# Patient Record
Sex: Female | Born: 1960
Health system: Northeastern US, Community
[De-identification: ages and names within clinical notes are randomized; demographics above are authoritative.]

## PROBLEM LIST (undated history)

## (undated) DIAGNOSIS — Z8489 Family history of other specified conditions: Secondary | ICD-10-CM

## (undated) DIAGNOSIS — J45909 Unspecified asthma, uncomplicated: Secondary | ICD-10-CM

## (undated) DIAGNOSIS — A159 Respiratory tuberculosis unspecified: Secondary | ICD-10-CM

## (undated) DIAGNOSIS — T7840XA Allergy, unspecified, initial encounter: Secondary | ICD-10-CM

## (undated) DIAGNOSIS — I10 Essential (primary) hypertension: Secondary | ICD-10-CM

## (undated) DIAGNOSIS — Z90710 Acquired absence of both cervix and uterus: Secondary | ICD-10-CM

## (undated) DIAGNOSIS — E039 Hypothyroidism, unspecified: Secondary | ICD-10-CM

## (undated) HISTORY — PX: COLONOSCOPY: SHX174

## (undated) HISTORY — PX: ABDOMINAL HYSTERECTOMY: SHX81

## (undated) HISTORY — DX: Allergy, unspecified, initial encounter: T78.40XA

---

## 1998-06-19 ENCOUNTER — Other Ambulatory Visit: Admission: RE | Admit: 1998-06-19 | Discharge: 1998-06-19 | Payer: Self-pay | Admitting: Gynecology

## 1999-02-01 ENCOUNTER — Other Ambulatory Visit: Admission: RE | Admit: 1999-02-01 | Discharge: 1999-02-01 | Payer: Self-pay | Admitting: Obstetrics and Gynecology

## 1999-03-22 ENCOUNTER — Ambulatory Visit (HOSPITAL_COMMUNITY): Admission: RE | Admit: 1999-03-22 | Discharge: 1999-03-22 | Payer: Self-pay | Admitting: *Deleted

## 1999-03-22 ENCOUNTER — Encounter: Payer: Self-pay | Admitting: Obstetrics and Gynecology

## 1999-03-22 ENCOUNTER — Encounter: Payer: Self-pay | Admitting: *Deleted

## 1999-08-19 ENCOUNTER — Inpatient Hospital Stay (HOSPITAL_COMMUNITY): Admission: AD | Admit: 1999-08-19 | Discharge: 1999-08-21 | Payer: Self-pay | Admitting: Obstetrics and Gynecology

## 1999-08-23 ENCOUNTER — Encounter: Admission: RE | Admit: 1999-08-23 | Discharge: 1999-11-21 | Payer: Self-pay | Admitting: Obstetrics and Gynecology

## 2000-01-17 ENCOUNTER — Other Ambulatory Visit: Admission: RE | Admit: 2000-01-17 | Discharge: 2000-01-17 | Payer: Self-pay | Admitting: Obstetrics and Gynecology

## 2001-02-26 ENCOUNTER — Other Ambulatory Visit: Admission: RE | Admit: 2001-02-26 | Discharge: 2001-02-26 | Payer: Self-pay | Admitting: Obstetrics and Gynecology

## 2002-03-14 ENCOUNTER — Other Ambulatory Visit: Admission: RE | Admit: 2002-03-14 | Discharge: 2002-03-14 | Payer: Self-pay | Admitting: Obstetrics and Gynecology

## 2002-05-10 ENCOUNTER — Encounter: Payer: Self-pay | Admitting: Obstetrics and Gynecology

## 2002-05-10 ENCOUNTER — Encounter: Admission: RE | Admit: 2002-05-10 | Discharge: 2002-05-10 | Payer: Self-pay | Admitting: Obstetrics and Gynecology

## 2003-04-18 ENCOUNTER — Other Ambulatory Visit: Admission: RE | Admit: 2003-04-18 | Discharge: 2003-04-18 | Payer: Self-pay | Admitting: Obstetrics and Gynecology

## 2003-04-25 ENCOUNTER — Encounter: Admission: RE | Admit: 2003-04-25 | Discharge: 2003-04-25 | Payer: Self-pay | Admitting: Obstetrics and Gynecology

## 2003-04-25 ENCOUNTER — Encounter: Payer: Self-pay | Admitting: Obstetrics and Gynecology

## 2004-04-18 ENCOUNTER — Other Ambulatory Visit: Admission: RE | Admit: 2004-04-18 | Discharge: 2004-04-18 | Payer: Self-pay | Admitting: Obstetrics and Gynecology

## 2004-05-21 ENCOUNTER — Encounter: Admission: RE | Admit: 2004-05-21 | Discharge: 2004-05-21 | Payer: Self-pay | Admitting: Obstetrics and Gynecology

## 2005-06-02 ENCOUNTER — Other Ambulatory Visit: Admission: RE | Admit: 2005-06-02 | Discharge: 2005-06-02 | Payer: Self-pay | Admitting: Obstetrics and Gynecology

## 2005-07-18 ENCOUNTER — Encounter: Admission: RE | Admit: 2005-07-18 | Discharge: 2005-07-18 | Payer: Self-pay | Admitting: Obstetrics and Gynecology

## 2006-08-10 ENCOUNTER — Encounter: Admission: RE | Admit: 2006-08-10 | Discharge: 2006-08-10 | Payer: Self-pay | Admitting: Obstetrics and Gynecology

## 2007-09-07 ENCOUNTER — Encounter: Admission: RE | Admit: 2007-09-07 | Discharge: 2007-09-07 | Payer: Self-pay | Admitting: Obstetrics and Gynecology

## 2008-01-06 ENCOUNTER — Ambulatory Visit: Payer: Self-pay | Admitting: Chiropractic Medicine

## 2008-09-07 ENCOUNTER — Encounter: Admission: RE | Admit: 2008-09-07 | Discharge: 2008-09-07 | Payer: Self-pay | Admitting: Obstetrics and Gynecology

## 2009-09-10 ENCOUNTER — Encounter: Admission: RE | Admit: 2009-09-10 | Discharge: 2009-09-10 | Payer: Self-pay | Admitting: Obstetrics and Gynecology

## 2010-08-24 ENCOUNTER — Other Ambulatory Visit: Payer: Self-pay | Admitting: Obstetrics and Gynecology

## 2010-08-24 DIAGNOSIS — Z1231 Encounter for screening mammogram for malignant neoplasm of breast: Secondary | ICD-10-CM

## 2010-08-24 DIAGNOSIS — Z1239 Encounter for other screening for malignant neoplasm of breast: Secondary | ICD-10-CM

## 2010-09-11 ENCOUNTER — Ambulatory Visit
Admission: RE | Admit: 2010-09-11 | Discharge: 2010-09-11 | Disposition: A | Discharge status: Home / Self Care | Payer: PRIVATE HEALTH INSURANCE | Source: Ambulatory Visit | Attending: Obstetrics and Gynecology | Admitting: Obstetrics and Gynecology

## 2010-09-11 DIAGNOSIS — Z1231 Encounter for screening mammogram for malignant neoplasm of breast: Secondary | ICD-10-CM

## 2011-09-02 ENCOUNTER — Other Ambulatory Visit: Payer: Self-pay | Admitting: Obstetrics and Gynecology

## 2011-09-02 DIAGNOSIS — Z1231 Encounter for screening mammogram for malignant neoplasm of breast: Secondary | ICD-10-CM

## 2011-09-23 ENCOUNTER — Ambulatory Visit
Admission: RE | Admit: 2011-09-23 | Discharge: 2011-09-23 | Disposition: A | Discharge status: Home / Self Care | Payer: PRIVATE HEALTH INSURANCE | Source: Ambulatory Visit | Attending: Obstetrics and Gynecology | Admitting: Obstetrics and Gynecology

## 2011-09-23 DIAGNOSIS — Z1231 Encounter for screening mammogram for malignant neoplasm of breast: Secondary | ICD-10-CM

## 2011-11-12 LAB — HM MAMMOGRAPHY: HM Mammogram: NORMAL

## 2012-07-29 ENCOUNTER — Telehealth: Payer: Self-pay | Admitting: Internal Medicine

## 2012-07-29 NOTE — Telephone Encounter (Signed)
Cam we use a 30 minute physical slot to work her in before March? If not, you can use 2 urgents

## 2012-07-29 NOTE — Telephone Encounter (Signed)
Patient would like to come in and see you, she states her blood pressure has been up and she saw you at the old practice. I advised patient that she would be a new patient and she didn't want to wait until March to be able to see you. I did offer her an appointment with Raquel, however she wanted me to ask if you would see her.  She states her whole family comes here. We can also call her on her cell [phone]

## 2012-08-02 LAB — HM PAP SMEAR: HM Pap smear: NORMAL

## 2012-08-03 NOTE — Telephone Encounter (Signed)
Called and left message asking patient to return my call so I could make her appointment.

## 2012-08-09 NOTE — Telephone Encounter (Signed)
Appointment 1/101/14 pt aware of appointment

## 2012-08-13 ENCOUNTER — Encounter: Payer: Self-pay | Admitting: Internal Medicine

## 2012-08-13 ENCOUNTER — Ambulatory Visit (INDEPENDENT_AMBULATORY_CARE_PROVIDER_SITE_OTHER): Payer: PRIVATE HEALTH INSURANCE | Admitting: Internal Medicine

## 2012-08-13 VITALS — BP 120/66 | HR 72 | Temp 98.3°F | Resp 16 | Wt 160.5 lb

## 2012-08-13 DIAGNOSIS — Z79899 Other long term (current) drug therapy: Secondary | ICD-10-CM

## 2012-08-13 DIAGNOSIS — Z1211 Encounter for screening for malignant neoplasm of colon: Secondary | ICD-10-CM

## 2012-08-13 DIAGNOSIS — I10 Essential (primary) hypertension: Secondary | ICD-10-CM

## 2012-08-13 DIAGNOSIS — Z13220 Encounter for screening for lipoid disorders: Secondary | ICD-10-CM

## 2012-08-13 NOTE — Patient Instructions (Addendum)
Return at your leisure for fasting lipids and other blood tests  (ok to drink water past midnight but no other beverages or food )  I will refill your lisinopril as soon as I review the labs from today

## 2012-08-13 NOTE — Progress Notes (Signed)
Patient ID: Tonya Gregory, female   DOB: 1961/05/30, 52 y.o.   MRN: [id number]  Patient Active Problem List  Diagnosis  . Hypertension  . Screening for lipoid disorders    Subjective:  CC:   Chief Complaint  Patient presents with  . Blood Pressure    HPI:   Tonya Gregory a 52 y.o. female who presents  History reviewed. No pertinent past medical history.  History reviewed. No pertinent past surgical history.       The following portions of the patient's history were reviewed and updated as appropriate: Allergies, current medications, and problem list.    Review of Systems:  Patient denies headache, fevers, malaise, unintentional weight loss, skin rash, eye pain, sinus congestion and sinus pain, sore throat, dysphagia,  hemoptysis , cough, dyspnea, wheezing, chest pain, palpitations, orthopnea, edema, abdominal pain, nausea, melena, diarrhea, constipation, flank pain, dysuria, hematuria, urinary  Frequency, nocturia, numbness, tingling, seizures,  Focal weakness, Loss of consciousness,  Tremor, insomnia, depression, anxiety, and suicidal ideation.       History   Social History  . Marital Status: Married    Spouse Name: N/A    Number of Children: N/A  . Years of Education: N/A   Occupational History  . Not on file.   Social History Main Topics  . Smoking status: Never Smoker   . Smokeless tobacco: Not on file  . Alcohol Use: Yes  . Drug Use: No  . Sexually Active:    Other Topics Concern  . Not on file   Social History Narrative  . No narrative on file    Objective:  BP 120/66  Pulse 72  Temp 98.3 F (36.8 C) (Oral)  Resp 16  Wt 160 lb 8 oz (72.802 kg)  SpO2 99%  LMP 07/13/2012  General appearance: alert, cooperative and appears stated age Ears: normal TM's and external ear canals both ears Throat: lips, mucosa, and tongue normal; teeth and gums normal Neck: no adenopathy, no carotid bruit, supple, symmetrical, trachea midline and thyroid  not enlarged, symmetric, no tenderness/mass/nodules Back: symmetric, no curvature. ROM normal. No CVA tenderness. Lungs: clear to auscultation bilaterally Heart: regular rate and rhythm, S1, S2 normal, no murmur, click, rub or gallop Abdomen: soft, non-tender; bowel sounds normal; no masses,  no organomegaly Pulses: 2+ and symmetric Skin: Skin color, texture, turgor normal. No rashes or lesions Lymph nodes: Cervical, supraclavicular, and axillary nodes normal.  Assessment and Plan:  Hypertension Discussed. She has no history of snoring or sleep apnea. She does not use NSAIDs on a regular basis as well as decongestants. She has no history of thyroid problems diabetes or nephropathy. She is tolerating lisinopril without side effects. Potassium and creatinine are both normal. Continue current medication. Return for fasting lipids.  Screening for lipoid disorders Given her maternal history of stroke in her new onset hypertension she will return for fasting lipids and we will treat aggressively for LDL goal 100 of 100.    Updated Medication List Outpatient Encounter Prescriptions as of 08/13/2012  Medication Sig Dispense Refill  . lisinopril (PRINIVIL,ZESTRIL) 10 MG tablet Take 10 mg by mouth daily.          Orders Placed This Encounter  Procedures  . HM MAMMOGRAPHY  . HM PAP SMEAR  . Basic Metabolic Panel (BMET)  . Ambulatory referral to Gastroenterology    Return in about 6 months (around 02/10/2013).

## 2012-08-14 LAB — BASIC METABOLIC PANEL
BUN: 15 mg/dL (ref 6–23)
CO2: 26 mEq/L (ref 19–32)
Calcium: 9.3 mg/dL (ref 8.4–10.5)
Chloride: 104 mEq/L (ref 96–112)
Creat: 0.79 mg/dL (ref 0.50–1.10)
Glucose, Bld: 79 mg/dL (ref 70–99)
Potassium: 4.2 mEq/L (ref 3.5–5.3)
Sodium: 135 mEq/L (ref 135–145)

## 2012-08-15 DIAGNOSIS — I10 Essential (primary) hypertension: Secondary | ICD-10-CM | POA: Insufficient documentation

## 2012-08-15 DIAGNOSIS — Z13220 Encounter for screening for lipoid disorders: Secondary | ICD-10-CM | POA: Insufficient documentation

## 2012-08-15 NOTE — Assessment & Plan Note (Signed)
She is tolerating lisinopril without side effects. Potassium and creatinine are both normal. Continue current medication. Return for fasting lipids.

## 2012-08-15 NOTE — Assessment & Plan Note (Signed)
Given her maternal history of stroke in her new onset hypertension she will return for fasting lipids and we will treat aggressively for LDL goal 100 of 100.

## 2012-08-18 ENCOUNTER — Other Ambulatory Visit (INDEPENDENT_AMBULATORY_CARE_PROVIDER_SITE_OTHER): Payer: PRIVATE HEALTH INSURANCE

## 2012-08-18 DIAGNOSIS — I10 Essential (primary) hypertension: Secondary | ICD-10-CM

## 2012-08-18 LAB — MICROALBUMIN / CREATININE URINE RATIO
Creatinine,U: 96 mg/dL
Microalb Creat Ratio: 0.4 mg/g (ref 0.0–30.0)
Microalb, Ur: 0.4 mg/dL (ref 0.0–1.9)

## 2012-08-18 LAB — COMPREHENSIVE METABOLIC PANEL
ALT: 19 U/L (ref 0–35)
AST: 16 U/L (ref 0–37)
Albumin: 4 g/dL (ref 3.5–5.2)
Alkaline Phosphatase: 41 U/L (ref 39–117)
BUN: 17 mg/dL (ref 6–23)
CO2: 26 mEq/L (ref 19–32)
Calcium: 8.9 mg/dL (ref 8.4–10.5)
Chloride: 104 mEq/L (ref 96–112)
Creatinine, Ser: 0.9 mg/dL (ref 0.4–1.2)
GFR: 68.03 mL/min (ref >60.00)
Glucose, Bld: 109 mg/dL — ABNORMAL HIGH (ref 70–99)
Potassium: 4.4 mEq/L (ref 3.5–5.1)
Sodium: 136 mEq/L (ref 135–145)
Total Bilirubin: 1 mg/dL (ref 0.3–1.2)
Total Protein: 7.1 g/dL (ref 6.0–8.3)

## 2012-08-18 LAB — LIPID PANEL
Cholesterol: 181 mg/dL (ref 0–200)
HDL: 46.9 mg/dL (ref >39.00)
LDL Cholesterol: 117 mg/dL — ABNORMAL HIGH (ref 0–99)
Total CHOL/HDL Ratio: 4
Triglycerides: 86 mg/dL (ref 0.0–149.0)
VLDL: 17.2 mg/dL (ref 0.0–40.0)

## 2012-08-18 LAB — TSH: TSH: 1.37 u[IU]/mL (ref 0.35–5.50)

## 2012-08-23 ENCOUNTER — Other Ambulatory Visit: Payer: Self-pay | Admitting: General Practice

## 2012-08-23 ENCOUNTER — Other Ambulatory Visit: Payer: Self-pay | Admitting: Internal Medicine

## 2012-08-23 MED ORDER — LISINOPRIL 10 MG PO TABS: 10.0000 mg | ORAL_TABLET | Freq: Every day | ORAL | Status: DC

## 2012-08-23 NOTE — Telephone Encounter (Signed)
Med filled.  

## 2012-08-23 NOTE — Telephone Encounter (Signed)
lisinopril (PRINIVIL,ZESTRIL) 10 MG tablet  #30

## 2012-09-06 ENCOUNTER — Other Ambulatory Visit: Payer: Self-pay | Admitting: Obstetrics and Gynecology

## 2012-09-06 DIAGNOSIS — Z1231 Encounter for screening mammogram for malignant neoplasm of breast: Secondary | ICD-10-CM

## 2012-10-07 ENCOUNTER — Ambulatory Visit
Admission: RE | Admit: 2012-10-07 | Discharge: 2012-10-07 | Disposition: A | Discharge status: Home / Self Care | Payer: PRIVATE HEALTH INSURANCE | Source: Ambulatory Visit | Attending: Obstetrics and Gynecology | Admitting: Obstetrics and Gynecology

## 2012-10-07 DIAGNOSIS — Z1231 Encounter for screening mammogram for malignant neoplasm of breast: Secondary | ICD-10-CM

## 2012-10-14 ENCOUNTER — Other Ambulatory Visit: Payer: Self-pay | Admitting: Obstetrics and Gynecology

## 2012-10-14 DIAGNOSIS — R928 Other abnormal and inconclusive findings on diagnostic imaging of breast: Secondary | ICD-10-CM

## 2012-10-26 ENCOUNTER — Ambulatory Visit
Admission: RE | Admit: 2012-10-26 | Discharge: 2012-10-26 | Disposition: A | Discharge status: Home / Self Care | Payer: PRIVATE HEALTH INSURANCE | Source: Ambulatory Visit | Attending: Obstetrics and Gynecology | Admitting: Obstetrics and Gynecology

## 2012-10-26 DIAGNOSIS — R928 Other abnormal and inconclusive findings on diagnostic imaging of breast: Secondary | ICD-10-CM

## 2012-11-11 ENCOUNTER — Other Ambulatory Visit: Payer: Self-pay | Admitting: Internal Medicine

## 2012-11-11 NOTE — Telephone Encounter (Signed)
Med filled.  

## 2012-12-01 LAB — HM COLONOSCOPY: HM Colonoscopy: NORMAL

## 2012-12-02 ENCOUNTER — Ambulatory Visit: Payer: Self-pay | Admitting: Gastroenterology

## 2013-01-17 ENCOUNTER — Encounter: Payer: Self-pay | Admitting: Internal Medicine

## 2013-02-26 ENCOUNTER — Other Ambulatory Visit: Payer: Self-pay | Admitting: Internal Medicine

## 2013-02-27 NOTE — Telephone Encounter (Signed)
Refill done . Please remind patient to return for 6 month  Follow up on hypertension prior to any more refills

## 2013-06-03 ENCOUNTER — Other Ambulatory Visit: Payer: Self-pay | Admitting: Internal Medicine

## 2013-10-03 ENCOUNTER — Other Ambulatory Visit: Payer: Self-pay

## 2013-10-03 DIAGNOSIS — Z1231 Encounter for screening mammogram for malignant neoplasm of breast: Secondary | ICD-10-CM

## 2013-10-13 ENCOUNTER — Ambulatory Visit
Admission: RE | Admit: 2013-10-13 | Discharge: 2013-10-13 | Disposition: A | Discharge status: Home / Self Care | Payer: PRIVATE HEALTH INSURANCE | Source: Ambulatory Visit

## 2013-10-13 DIAGNOSIS — Z1231 Encounter for screening mammogram for malignant neoplasm of breast: Secondary | ICD-10-CM

## 2013-10-18 ENCOUNTER — Other Ambulatory Visit: Payer: Self-pay | Admitting: Internal Medicine

## 2013-10-24 ENCOUNTER — Other Ambulatory Visit: Payer: Self-pay | Admitting: Internal Medicine

## 2013-10-24 MED ORDER — LISINOPRIL 10 MG PO TABS: ORAL_TABLET | ORAL | Status: DC

## 2013-10-24 NOTE — Addendum Note (Signed)
Addended by: Vernetta Honey on: 10/24/2013 04:40 PM   Modules accepted: Orders

## 2013-10-31 LAB — HM MAMMOGRAPHY: HM Mammogram: NORMAL

## 2013-11-20 ENCOUNTER — Other Ambulatory Visit: Payer: Self-pay | Admitting: Internal Medicine

## 2013-12-01 ENCOUNTER — Ambulatory Visit (INDEPENDENT_AMBULATORY_CARE_PROVIDER_SITE_OTHER): Payer: PRIVATE HEALTH INSURANCE | Admitting: Internal Medicine

## 2013-12-01 ENCOUNTER — Encounter: Payer: Self-pay | Admitting: Internal Medicine

## 2013-12-01 VITALS — BP 120/72 | HR 67 | Temp 97.9°F | Resp 16 | Ht 68.5 in | Wt 166.0 lb

## 2013-12-01 DIAGNOSIS — E559 Vitamin D deficiency, unspecified: Secondary | ICD-10-CM

## 2013-12-01 DIAGNOSIS — Z79899 Other long term (current) drug therapy: Secondary | ICD-10-CM

## 2013-12-01 DIAGNOSIS — N926 Irregular menstruation, unspecified: Secondary | ICD-10-CM

## 2013-12-01 DIAGNOSIS — I10 Essential (primary) hypertension: Secondary | ICD-10-CM

## 2013-12-01 DIAGNOSIS — Z13220 Encounter for screening for lipoid disorders: Secondary | ICD-10-CM

## 2013-12-01 DIAGNOSIS — E785 Hyperlipidemia, unspecified: Secondary | ICD-10-CM

## 2013-12-01 DIAGNOSIS — Z0000 Encounter for general adult medical examination without abnormal findings: Secondary | ICD-10-CM

## 2013-12-01 LAB — COMPREHENSIVE METABOLIC PANEL
ALT: 22 U/L (ref 0–35)
AST: 19 U/L (ref 0–37)
Albumin: 4.2 g/dL (ref 3.5–5.2)
Alkaline Phosphatase: 43 U/L (ref 39–117)
BUN: 15 mg/dL (ref 6–23)
CO2: 25 mEq/L (ref 19–32)
Calcium: 9.2 mg/dL (ref 8.4–10.5)
Chloride: 105 mEq/L (ref 96–112)
Creatinine, Ser: 0.7 mg/dL (ref 0.4–1.2)
GFR: 92.79 mL/min (ref >60.00)
Glucose, Bld: 99 mg/dL (ref 70–99)
Potassium: 4.5 mEq/L (ref 3.5–5.1)
Sodium: 137 mEq/L (ref 135–145)
Total Bilirubin: 0.8 mg/dL (ref 0.3–1.2)
Total Protein: 6.9 g/dL (ref 6.0–8.3)

## 2013-12-01 LAB — CBC WITH DIFFERENTIAL/PLATELET
Basophils Absolute: 0 10*3/uL (ref 0.0–0.1)
Basophils Relative: 0.4 % (ref 0.0–3.0)
Eosinophils Absolute: 0.1 10*3/uL (ref 0.0–0.7)
Eosinophils Relative: 2.1 % (ref 0.0–5.0)
HCT: 37.7 % (ref 36.0–46.0)
Hemoglobin: 12.8 g/dL (ref 12.0–15.0)
Lymphocytes Relative: 40.4 % (ref 12.0–46.0)
Lymphs Abs: 2 10*3/uL (ref 0.7–4.0)
MCHC: 33.8 g/dL (ref 30.0–36.0)
MCV: 91.8 fl (ref 78.0–100.0)
Monocytes Absolute: 0.3 10*3/uL (ref 0.1–1.0)
Monocytes Relative: 6.3 % (ref 3.0–12.0)
Neutro Abs: 2.6 10*3/uL (ref 1.4–7.7)
Neutrophils Relative %: 50.8 % (ref 43.0–77.0)
Platelets: 246 10*3/uL (ref 150.0–400.0)
RBC: 4.11 Mil/uL (ref 3.87–5.11)
RDW: 12.6 % (ref 11.5–14.6)
WBC: 5 10*3/uL (ref 4.5–10.5)

## 2013-12-01 LAB — FOLLICLE STIMULATING HORMONE: FSH: 49.5 m[IU]/mL

## 2013-12-01 LAB — LIPID PANEL
Cholesterol: 193 mg/dL (ref 0–200)
HDL: 49.9 mg/dL (ref >39.00)
LDL Cholesterol: 125 mg/dL — ABNORMAL HIGH (ref 0–99)
Total CHOL/HDL Ratio: 4
Triglycerides: 93 mg/dL (ref 0.0–149.0)
VLDL: 18.6 mg/dL (ref 0.0–40.0)

## 2013-12-01 LAB — TSH: TSH: 1.67 u[IU]/mL (ref 0.35–5.50)

## 2013-12-01 LAB — LUTEINIZING HORMONE: LH: 12.09 m[IU]/mL

## 2013-12-01 NOTE — Progress Notes (Signed)
Pre-visit discussion using our clinic review tool. No additional management support is needed unless otherwise documented below in the visit note.  

## 2013-12-01 NOTE — Progress Notes (Signed)
Patient ID: Tonya Gregory, female   DOB: 1961/01/10, 53 y.o.   MRN: [id number]   Subjective:     Tonya Gregory is a 53 y.o. female and is here for a comprehensive physical exam. The patient reports occasional low back pain,  Has DDD by prior MRI and not interested in surgery since pain is not constant  Still having menstrual periods with cramping and heavy flow but flow ends after 3 days    History   Social History  . Marital Status: Married    Spouse Name: N/A    Number of Children: N/A  . Years of Education: N/A   Occupational History  . Not on file.   Social History Main Topics  . Smoking status: Never Smoker   . Smokeless tobacco: Not on file  . Alcohol Use: Yes  . Drug Use: No  . Sexual Activity:    Other Topics Concern  . Not on file   Social History Narrative  . No narrative on file   Health Maintenance  Topic Date Due  . Tetanus/tdap  03/26/1979  . Influenza Vaccine  03/04/2014  . Pap Smear  08/03/2015  . Mammogram  11/01/2015  . Colonoscopy  12/03/2022    The following portions of the patient's history were reviewed and updated as appropriate: allergies, current medications, past family history, past medical history, past social history, past surgical history and problem list.  Review of Systems A comprehensive review of systems was negative.   Objective:    BP 120/72  Pulse 67  Temp(Src) 97.9 F (36.6 C) (Oral)  Resp 16  Ht 5' 8.5" (1.74 m)  Wt 166 lb (75.297 kg)  BMI 24.87 kg/m2  SpO2 99%  LMP 11/24/2013  General appearance: alert, cooperative and appears stated age Head: Normocephalic, without obvious abnormality, atraumatic Eyes: conjunctivae/corneas clear. PERRL, EOM's intact. Fundi benign. Ears: normal TM's and external ear canals both ears Nose: Nares normal. Septum midline. Mucosa normal. No drainage or sinus tenderness. Throat: lips, mucosa, and tongue normal; teeth and gums normal Neck: no adenopathy, no carotid bruit, no JVD,  supple, symmetrical, trachea midline and thyroid not enlarged, symmetric, no tenderness/mass/nodules Lungs: clear to auscultation bilaterally Breasts: normal appearance, no masses or tenderness Heart: regular rate and rhythm, S1, S2 normal, no murmur, click, rub or gallop Abdomen: soft, non-tender; bowel sounds normal; no masses,  no organomegaly Extremities: extremities normal, atraumatic, no cyanosis or edema Pulses: 2+ and symmetric Skin: Skin color, texture, turgor normal. No rashes or lesions Neurologic: Alert and oriented X 3, normal strength and tone. Normal symmetric reflexes. Normal coordination and gait.    Assessment and Plan:   Hypertension Well controlled on current regimen. Renal function stable, no changes today.  Lab Results  Component Value Date   CREATININE 0.7 12/01/2013    Lab Results  Component Value Date   NA 137 12/01/2013   K 4.5 12/01/2013   CL 105 12/01/2013   CO2 25 12/01/2013    Screening for lipoid disorders Lab Results  Component Value Date   CHOL 193 12/01/2013   HDL 49.90 12/01/2013   LDLCALC 125* 12/01/2013   TRIG 93.0 12/01/2013   CHOLHDL 4 12/01/2013   cholesterol, liver and kidney function are normal.  No medicatiobs indicated  Encounter for preventive health examination Annual comprehensive exam was done excluding breast, pelvic and PAP smear. All screenings have been addressed .    Updated Medication List Outpatient Encounter Prescriptions as of 12/01/2013  Medication Sig  . lisinopril (PRINIVIL,ZESTRIL)  10 MG tablet TAKE 1 TABLET (10 MG TOTAL) BY MOUTH DAILY.

## 2013-12-01 NOTE — Patient Instructions (Signed)
You had your annual  wellness exam today.  We will repeat your PAP smear in 2016,    We will contact you with the bloodwork results

## 2013-12-02 LAB — VITAMIN D 25 HYDROXY (VIT D DEFICIENCY, FRACTURES): Vit D, 25-Hydroxy: 36 ng/mL (ref 30–89)

## 2013-12-03 DIAGNOSIS — Z0000 Encounter for general adult medical examination without abnormal findings: Secondary | ICD-10-CM | POA: Insufficient documentation

## 2013-12-03 NOTE — Assessment & Plan Note (Signed)
Annual comprehensive exam was done excluding breast, pelvic and PAP smear. All screenings have been addressed .  

## 2013-12-03 NOTE — Assessment & Plan Note (Signed)
Well controlled on current regimen. Renal function stable, no changes today.  Lab Results  Component Value Date   CREATININE 0.7 12/01/2013    Lab Results  Component Value Date   NA 137 12/01/2013   K 4.5 12/01/2013   CL 105 12/01/2013   CO2 25 12/01/2013

## 2013-12-03 NOTE — Assessment & Plan Note (Signed)
Lab Results  Component Value Date   CHOL 193 12/01/2013   HDL 49.90 12/01/2013   LDLCALC 125* 12/01/2013   TRIG 93.0 12/01/2013   CHOLHDL 4 12/01/2013   cholesterol, liver and kidney function are normal.  No medicatiobs indicated

## 2013-12-05 ENCOUNTER — Encounter: Payer: Self-pay | Admitting: *Deleted

## 2013-12-28 ENCOUNTER — Other Ambulatory Visit: Payer: Self-pay | Admitting: Internal Medicine

## 2014-06-04 ENCOUNTER — Other Ambulatory Visit: Payer: Self-pay | Admitting: Internal Medicine

## 2014-08-28 ENCOUNTER — Encounter: Payer: Self-pay | Admitting: Internal Medicine

## 2014-08-28 ENCOUNTER — Ambulatory Visit (INDEPENDENT_AMBULATORY_CARE_PROVIDER_SITE_OTHER): Payer: PRIVATE HEALTH INSURANCE | Admitting: Internal Medicine

## 2014-08-28 VITALS — BP 104/66 | HR 78 | Temp 98.4°F | Resp 14 | Ht 68.5 in | Wt 169.2 lb

## 2014-08-28 DIAGNOSIS — R5081 Fever presenting with conditions classified elsewhere: Secondary | ICD-10-CM

## 2014-08-28 DIAGNOSIS — I10 Essential (primary) hypertension: Secondary | ICD-10-CM

## 2014-08-28 DIAGNOSIS — J020 Streptococcal pharyngitis: Secondary | ICD-10-CM | POA: Insufficient documentation

## 2014-08-28 DIAGNOSIS — R509 Fever, unspecified: Secondary | ICD-10-CM

## 2014-08-28 LAB — POCT RAPID STREP A (OFFICE): Rapid Strep A Screen: POSITIVE — AB

## 2014-08-28 MED ORDER — AMOXICILLIN-POT CLAVULANATE 875-125 MG PO TABS: 1.0000 | ORAL_TABLET | Freq: Two times a day (BID) | ORAL | Status: DC

## 2014-08-28 MED ORDER — CULTURELLE DIGESTIVE HEALTH PO CAPS: 1.0000 | ORAL_CAPSULE | Freq: Every day | ORAL | Status: DC

## 2014-08-28 NOTE — Progress Notes (Signed)
Patient ID: Tonya Gregory, female   DOB: 12-27-1960, 54 y.o.   MRN: [id number]   Patient Active Problem List   Diagnosis Date Noted  . Fever presenting with conditions classified elsewhere 08/29/2014  . Streptococcal pharyngitis 08/28/2014  . Encounter for preventive health examination 12/03/2013  . Hypertension 08/15/2012  . Screening for lipoid disorders 08/15/2012    Subjective:  CC:   Chief Complaint  Patient presents with  . Fever    104 taking advil alternating with tylenol, no other symptom just fever    HPI:   Tonya Gregory is a 54 y.o. female who presents for  5 day history of recurrent fevers up to 104 Fahrenheit,  Last one last evening.  Started last Thursday,  Was vacationing  in [place] until January 1 .  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  Daughter with viral URi symptoms,  but no fevers.  No arthritis symptoms,  No rash,  But mild persistent nausea,  Attributed to use of advil but has not been overdoing it and has been alternating with tylenol for management of fever.    Has suspended her lisinopril for the last few days due to feeling poorly.  No back pain headaches or neck pain.     No past medical history on file.  No past surgical history on file.     The following portions of the patient's history were reviewed and updated as appropriate: Allergies, current medications, and problem list.    Review of Systems:   Patient denies headache,  unintentional weight loss, skin rash, eye pain, sinus congestion and sinus pain, sore throat, dysphagia,  hemoptysis , cough, dyspnea, wheezing, chest pain, palpitations, orthopnea, edema, abdominal pain,  melena, diarrhea, constipation, flank pain, dysuria, hematuria, urinary  Frequency, nocturia, numbness, tingling, seizures,  Focal weakness, Loss of consciousness,  Tremor, insomnia, depression, anxiety, and suicidal ideation.     History   Social  History  . Marital Status: Married    Spouse Name: N/A    Number of Children: N/A  . Years of Education: N/A   Occupational History  . Not on file.   Social History Main Topics  . Smoking status: Never Smoker   . Smokeless tobacco: Not on file  . Alcohol Use: Yes  . Drug Use: No  . Sexual Activity: Not on file   Other Topics Concern  . Not on file   Social History Narrative    Objective:  Filed Vitals:   08/28/14 1501  BP: 104/66  Pulse: 78  Temp: 98.4 F (36.9 C)  Resp: 14     General appearance: alert, cooperative and appears stated age Ears: normal TM's and external ear canals both ears Throat: lips, mucosa, and tongue normal; teeth and gums normal Neck: mild cervical lympadenopathy, no carotid bruit, supple, symmetrical, trachea midline and thyroid not enlarged, symmetric, no tenderness/mass/nodules Back: symmetric, no curvature. ROM normal. No CVA tenderness. Lungs: clear to auscultation bilaterally Heart: regular rate and rhythm, S1, S2 normal, no murmur, click, rub or gallop Abdomen: soft, non-tender; bowel sounds normal; no masses,  no organomegaly Pulses: 2+ and symmetric Skin: Skin color, texture, turgor normal. No rashes or lesions Lymph nodes: Cervical, supraclavicular, and axillary nodes normal.  Assessment and Plan:  Problem List Items Addressed This Visit    Fever presenting with conditions classified elsewhere    Considering her recent [place] travel,  ddx includes  dengue fever and  chikungunya,  But she is outside the incubation period for both infections,  and fever and nausea are her only symptoms.  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 Probiotics x 3 weeks advised. CBC is pending        Hypertension    Advised to suspend lisinopril until SBP is > 140      Streptococcal pharyngitis    Presenting with 5 days of high fever,   And nausea.  Rapid strep test was positive.  Augmentin x 7 days        Other Visit Diagnoses    Recurrent fever of unknown cause    -  Primary    Relevant Orders    Basic metabolic panel    CBC with Differential/Platelet    POCT rapid strep A (Completed)

## 2014-08-28 NOTE — Progress Notes (Signed)
Pre-visit discussion using our clinic review tool. No additional management support is needed unless otherwise documented below in the visit note.  

## 2014-08-28 NOTE — Patient Instructions (Signed)
Your fevers may be coming from e viral infection,  Or from strep throat  If your labs are normal and the strep test is negative,  Let's give it a full week to see if the infection  resolves.

## 2014-08-29 DIAGNOSIS — R5081 Fever presenting with conditions classified elsewhere: Secondary | ICD-10-CM | POA: Insufficient documentation

## 2014-08-29 LAB — CBC WITH DIFFERENTIAL/PLATELET
Basophils Absolute: 0 10*3/uL (ref 0.0–0.1)
Basophils Relative: 0.2 % (ref 0.0–3.0)
Eosinophils Absolute: 0 10*3/uL (ref 0.0–0.7)
Eosinophils Relative: 0.7 % (ref 0.0–5.0)
HCT: 37.8 % (ref 36.0–46.0)
Hemoglobin: 12.9 g/dL (ref 12.0–15.0)
Lymphocytes Relative: 21.1 % (ref 12.0–46.0)
Lymphs Abs: 1.1 10*3/uL (ref 0.7–4.0)
MCHC: 34.1 g/dL (ref 30.0–36.0)
MCV: 85.8 fl (ref 78.0–100.0)
Monocytes Absolute: 0.2 10*3/uL (ref 0.1–1.0)
Monocytes Relative: 4.8 % (ref 3.0–12.0)
Neutro Abs: 3.8 10*3/uL (ref 1.4–7.7)
Neutrophils Relative %: 73.2 % (ref 43.0–77.0)
Platelets: 100 10*3/uL — ABNORMAL LOW (ref 150.0–400.0)
RBC: 4.41 Mil/uL (ref 3.87–5.11)
RDW: 13.4 % (ref 11.5–15.5)
WBC: 5.2 10*3/uL (ref 4.0–10.5)

## 2014-08-29 LAB — BASIC METABOLIC PANEL
BUN: 10 mg/dL (ref 6–23)
CO2: 24 mEq/L (ref 19–32)
Calcium: 9 mg/dL (ref 8.4–10.5)
Chloride: 102 mEq/L (ref 96–112)
Creatinine, Ser: 0.76 mg/dL (ref 0.40–1.20)
GFR: 84.16 mL/min (ref >60.00)
Glucose, Bld: 126 mg/dL — ABNORMAL HIGH (ref 70–99)
Potassium: 4.3 mEq/L (ref 3.5–5.1)
Sodium: 136 mEq/L (ref 135–145)

## 2014-08-29 NOTE — Assessment & Plan Note (Signed)
Advised to suspend lisinopril until SBP is > 140

## 2014-08-29 NOTE — Assessment & Plan Note (Signed)
Presenting with 5 days of high fever,  And nausea.  Rapid strep test was positive.  Augmentin x 7 days

## 2014-08-29 NOTE — Assessment & Plan Note (Addendum)
Considering her recent [place] travel,  ddx includes  dengue fever and chikungunya,  But she is outside the incubation period for both infections,  and fever and nausea are her only symptoms.  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 Probiotics x 3 weeks advised. CBC is pending

## 2014-09-01 ENCOUNTER — Telehealth: Payer: Self-pay | Admitting: *Deleted

## 2014-09-01 DIAGNOSIS — R509 Fever, unspecified: Secondary | ICD-10-CM

## 2014-09-01 NOTE — Telephone Encounter (Signed)
Pt notified and  verbalized understanding. Advised [facility] would call with an appt

## 2014-09-01 NOTE — Telephone Encounter (Signed)
HER BLOOD WORK WAS NORMAL, NO SIGNS OF INFECTION , ONLY THE RAPID STREP TEST WAS POSITIVE. Marland Kitchen  IF SHE IS STILL HAVING FEVERS DESPITE TAKING THE AUMENTIN,  I AM MAKING A REFERRAL TO INFECTIOUS DISEASE FOR FURTHER WORKUP

## 2014-09-01 NOTE — Telephone Encounter (Signed)
Pt states she still has temp of 102. No other symptoms, taking antibiotic as directed. States only feels better when she takes Ibuprofen. Also wants to know results of her blood test from earlier this week

## 2014-09-18 ENCOUNTER — Ambulatory Visit: Payer: PRIVATE HEALTH INSURANCE | Admitting: Infectious Diseases

## 2014-09-21 ENCOUNTER — Other Ambulatory Visit: Payer: Self-pay

## 2014-09-21 DIAGNOSIS — Z1231 Encounter for screening mammogram for malignant neoplasm of breast: Secondary | ICD-10-CM

## 2014-10-19 ENCOUNTER — Ambulatory Visit
Admission: RE | Admit: 2014-10-19 | Discharge: 2014-10-19 | Disposition: A | Discharge status: Home / Self Care | Payer: No Typology Code available for payment source | Source: Ambulatory Visit

## 2014-10-19 DIAGNOSIS — Z1231 Encounter for screening mammogram for malignant neoplasm of breast: Secondary | ICD-10-CM

## 2015-09-17 ENCOUNTER — Other Ambulatory Visit: Payer: Self-pay

## 2015-09-17 DIAGNOSIS — Z1231 Encounter for screening mammogram for malignant neoplasm of breast: Secondary | ICD-10-CM

## 2015-10-22 ENCOUNTER — Ambulatory Visit
Admission: RE | Admit: 2015-10-22 | Discharge: 2015-10-22 | Disposition: A | Discharge status: Home / Self Care | Payer: Managed Care, Other (non HMO) | Source: Ambulatory Visit

## 2015-10-22 DIAGNOSIS — Z1231 Encounter for screening mammogram for malignant neoplasm of breast: Secondary | ICD-10-CM

## 2015-11-06 NOTE — H&P (Signed)
Tonya Gregory is a 55 y.o. female here for Pre Op Consulting (Endometrial Polyp)  Referral from [facility].  HPI:  Pt presents for a preoperative visit to schedule a D&C, hysteroscopy, and polypectomy.  She has a hx of: postmenopausal bleeding  Workup has included:  10/23/15 TVUS -  Two fibroids seen 1 ant=11.5 mm 2 fundal ant =17.1 mm Thickened endometrium=18.04 mm Abn postmenopause  Rt ov wnl Lt simple ov cyst= 2.40 cm  EMBx: 10/15/15- endometrial polyps, neg for malignancy  Pap smear 11/2014- neg with neg HR HPV  She also notes concerning and embarrassing SUI  Past Medical History:  has a past medical history of Asthma without status asthmaticus.  Past Surgical History:  has a past surgical history that includes Colonoscopy. Family History: family history includes Colon polyps in her mother; Stroke in her mother. Social History:  reports that she has never smoked. She has never used smokeless tobacco. She reports that she drinks alcohol. She reports that she does not use illicit drugs. OB/GYN History:  OB History    Gravida Para Term Preterm AB TAB SAB Ectopic Multiple Living   3 3 3       3       Allergies: is allergic to ace inhibitors. Medications:  Current Outpatient Prescriptions:  . albuterol 90 mcg/actuation inhaler, Inhale 2 inhalations into the lungs 4 (four) times daily as needed for Wheezing., Disp: , Rfl:  . aspirin 81 MG EC tablet, Take 81 mg by mouth once daily. Reported on 10/30/2015 , Disp: , Rfl:  . lisinopril (PRINIVIL,ZESTRIL) 10 MG tablet, Take 10 mg by mouth once daily. Reported on 10/30/2015 , Disp: , Rfl:   Review of Systems: No SOB, no palpitations or chest pain, no new lower extremity edema, no nausea or vomiting or bowel or bladder complaints. See HPI for gyn specific ROS.  Exam:      Vitals:   10/30/15 1004  BP: 142/81  Pulse: 75    WDWN white female in NAD Body mass index is 26.4 kg/(m^2).  General: Patient is  well-groomed, well-nourished, appears stated age in no acute distress  HEENT: head is atraumatic and normocephalic, trachea is midline, neck is supple with no palpable nodules  CV: Regular rhythm and normal heart rate, no murmur  Pulm: Clear to auscultation throughout lung fields with no wheezing, crackles, or rhonchi. No increased work of breathing  Abdomen: soft , no mass, non-tender, no rebound tenderness, no hepatomegaly  Pelvic:  Deferred  Impression:   The primary encounter diagnosis was Post-menopausal bleeding. Diagnoses of Endometrial polyp and SUI (stress urinary incontinence, female) were also pertinent to this visit.    Plan:   - Preoperative visit: D&C hysteroscopy, polypectomy. Consents signed today. Risks of surgery were discussed with the patient including but not limited to: bleeding which may require transfusion; infection which may require antibiotics; injury to uterus or surrounding organs; intrauterine scarring which may impair future fertility; need for additional procedures including laparotomy or laparoscopy; and other postoperative/anesthesia complications. Written informed consent was obtained.  This is a scheduled same-day surgery. She will have a postop visit in 2 weeks to review operative findings and pathology.  - Stress Urinary Incontinence: We discussed the etiology of her stress incontinence without the overactive bladder components. We reviewed the diagnosis of stress incontinence (SUI) and discussed the symptoms she is experiencing.   - For treatment of SUI, we discussed expectant management versus nonsurgical therapy versus surgery. Nonsurgical options include Kegel exercises, with or without  physical therapy, as well as an incontinence pessary. Surgical options include a midurethral sling, which we explained is a synthetic mesh sling that acts like a hammock around the urethra to prevent leakage of urine.  At this time, she would  like to start with OTC bladder support, and return if desired.  No orders of the defined types were placed in this encounter.  Return in about 2 weeks (around 12/12/2015) for Postop check.

## 2015-11-15 ENCOUNTER — Encounter
Admission: RE | Admit: 2015-11-15 | Discharge: 2015-11-15 | Disposition: A | Discharge status: Home / Self Care | Payer: Managed Care, Other (non HMO) | Source: Ambulatory Visit | Attending: Obstetrics and Gynecology | Admitting: Obstetrics and Gynecology

## 2015-11-15 DIAGNOSIS — I10 Essential (primary) hypertension: Secondary | ICD-10-CM

## 2015-11-15 DIAGNOSIS — Z01812 Encounter for preprocedural laboratory examination: Secondary | ICD-10-CM | POA: Diagnosis present

## 2015-11-15 DIAGNOSIS — Z01810 Encounter for preprocedural cardiovascular examination: Secondary | ICD-10-CM | POA: Diagnosis not present

## 2015-11-15 HISTORY — DX: Unspecified asthma, uncomplicated: J45.909

## 2015-11-15 HISTORY — DX: Family history of other specified conditions: Z84.89

## 2015-11-15 HISTORY — DX: Essential (primary) hypertension: I10

## 2015-11-15 LAB — BASIC METABOLIC PANEL
Anion gap: 4 — ABNORMAL LOW (ref 5–15)
BUN: 17 mg/dL (ref 6–20)
CO2: 27 mmol/L (ref 22–32)
Calcium: 8.9 mg/dL (ref 8.9–10.3)
Chloride: 104 mmol/L (ref 101–111)
Creatinine, Ser: 0.7 mg/dL (ref 0.44–1.00)
GFR calc Af Amer: >60 mL/min (ref >60)
GFR calc non Af Amer: >60 mL/min (ref >60)
Glucose, Bld: 98 mg/dL (ref 65–99)
Potassium: 4.1 mmol/L (ref 3.5–5.1)
Sodium: 135 mmol/L (ref 135–145)

## 2015-11-15 LAB — TYPE AND SCREEN
ABO/RH(D): O POS
Antibody Screen: NEGATIVE

## 2015-11-15 LAB — CBC
HCT: 36.8 % (ref 35.0–47.0)
Hemoglobin: 12.4 g/dL (ref 12.0–16.0)
MCH: 29.2 pg (ref 26.0–34.0)
MCHC: 33.8 g/dL (ref 32.0–36.0)
MCV: 86.5 fL (ref 80.0–100.0)
Platelets: 188 10*3/uL (ref 150–440)
RBC: 4.26 MIL/uL (ref 3.80–5.20)
RDW: 13.8 % (ref 11.5–14.5)
WBC: 5.5 10*3/uL (ref 3.6–11.0)

## 2015-11-15 LAB — ABO/RH: ABO/RH(D): O POS

## 2015-11-15 NOTE — Patient Instructions (Addendum)
  Your procedure is scheduled on: Monday November 26, 2015. Report to Same Day Surgery. To find out your arrival time please call [phone] between 1PM - 3PM on Friday November 23, 2015 Remember: Instructions that are not followed completely may result in serious medical risk, up to and including death, or upon the discretion of your surgeon and anesthesiologist your surgery may need to be rescheduled.    _x___ 1. Do not eat food or drink liquids after midnight. No gum chewing or hard candies.     _x___ 2. No Alcohol for 24 hours before or after surgery.   ____ 3. Bring all medications with you on the day of surgery if instructed.    __x__ 4. Notify your doctor if there is any change in your medical condition     (cold, fever, infections).     Do not wear jewelry, make-up, hairpins, clips or nail polish.  Do not wear lotions, powders, or perfumes. You may wear deodorant.  Do not shave 48 hours prior to surgery. Men may shave face and neck.  Do not bring valuables to the hospital.    [facility] is not responsible for any belongings or valuables.               Contacts, dentures or bridgework may not be worn into surgery.  Leave your suitcase in the car. After surgery it may be brought to your room.  For patients admitted to the hospital, discharge time is determined by your treatment team.   Patients discharged the day of surgery will not be allowed to drive home.    Please read over the following fact sheets that you were given:   [facility] Preparing for Surgery  ____ Take these medicines the morning of surgery with A SIP OF WATER: NONE     ____ Fleet Enema (as directed)   ____ Use CHG Soap as directed on instruction sheet  __x__ Use inhalers on the day of surgery and bring to hospital day of surgery  ____ Stop metformin 2 days prior to surgery    ____ Take 1/2 of usual insulin dose the night before surgery and none on the morning of surgery.   ____ Stop  Coumadin/Plavix/aspirin on does not apply.  __x__ Stop Anti-inflammatories such as Advil, Aleve, Ibuprofen, Motrin, Naproxen, Naprosyn, Goodies powders or aspirin products 7 days prior to surgery.  OK to take Tylenol for pain.   __x__ Stop supplements until after surgery.  Can continue calcium. ____ Bring C-Pap to the hospital.

## 2015-11-15 NOTE — Pre-Procedure Instructions (Signed)
Incentive spirometer given to pt with instructions on how to use it, pt returned correct use of incentive spirometer.  Instructed to use 4-5 times per hour while awake after surgery.

## 2015-11-26 ENCOUNTER — Encounter: Payer: Self-pay | Admitting: *Deleted

## 2015-11-26 ENCOUNTER — Ambulatory Visit: Payer: Managed Care, Other (non HMO) | Admitting: Certified Registered Nurse Anesthetist

## 2015-11-26 ENCOUNTER — Ambulatory Visit
Admission: RE | Admit: 2015-11-26 | Discharge: 2015-11-26 | Disposition: A | Discharge status: Home / Self Care | Payer: Managed Care, Other (non HMO) | Source: Ambulatory Visit | Attending: Obstetrics and Gynecology | Admitting: Obstetrics and Gynecology

## 2015-11-26 ENCOUNTER — Encounter
Admission: RE | Disposition: A | Discharge status: Home / Self Care | Payer: Self-pay | Source: Ambulatory Visit | Attending: Obstetrics and Gynecology

## 2015-11-26 DIAGNOSIS — N950 Postmenopausal bleeding: Secondary | ICD-10-CM | POA: Insufficient documentation

## 2015-11-26 DIAGNOSIS — R938 Abnormal findings on diagnostic imaging of other specified body structures: Secondary | ICD-10-CM | POA: Insufficient documentation

## 2015-11-26 DIAGNOSIS — N840 Polyp of corpus uteri: Secondary | ICD-10-CM | POA: Diagnosis not present

## 2015-11-26 DIAGNOSIS — Z7951 Long term (current) use of inhaled steroids: Secondary | ICD-10-CM | POA: Insufficient documentation

## 2015-11-26 DIAGNOSIS — N72 Inflammatory disease of cervix uteri: Secondary | ICD-10-CM | POA: Diagnosis not present

## 2015-11-26 DIAGNOSIS — Z79899 Other long term (current) drug therapy: Secondary | ICD-10-CM | POA: Insufficient documentation

## 2015-11-26 DIAGNOSIS — Z888 Allergy status to other drugs, medicaments and biological substances status: Secondary | ICD-10-CM | POA: Insufficient documentation

## 2015-11-26 DIAGNOSIS — J45909 Unspecified asthma, uncomplicated: Secondary | ICD-10-CM | POA: Insufficient documentation

## 2015-11-26 DIAGNOSIS — Z7982 Long term (current) use of aspirin: Secondary | ICD-10-CM | POA: Diagnosis not present

## 2015-11-26 DIAGNOSIS — Z87891 Personal history of nicotine dependence: Secondary | ICD-10-CM | POA: Diagnosis not present

## 2015-11-26 DIAGNOSIS — I10 Essential (primary) hypertension: Secondary | ICD-10-CM | POA: Diagnosis not present

## 2015-11-26 HISTORY — PX: DILATATION & CURETTAGE/HYSTEROSCOPY WITH MYOSURE: SHX6511

## 2015-11-26 SURGERY — DILATATION & CURETTAGE/HYSTEROSCOPY WITH MYOSURE
Anesthesia: General | Wound class: Clean Contaminated

## 2015-11-26 MED ORDER — FAMOTIDINE 20 MG PO TABS
ORAL_TABLET | ORAL | Status: AC
  Administered 2015-11-26: 20 mg via ORAL
  Filled 2015-11-26: qty 1

## 2015-11-26 MED ORDER — FENTANYL CITRATE (PF) 100 MCG/2ML IJ SOLN
25.0000 ug | INTRAMUSCULAR | Status: DC | PRN
  Administered 2015-11-26: 25 ug via INTRAVENOUS

## 2015-11-26 MED ORDER — OXYCODONE-ACETAMINOPHEN 5-325 MG PO TABS: 1.0000 | ORAL_TABLET | Freq: Four times a day (QID) | ORAL | Status: DC | PRN

## 2015-11-26 MED ORDER — SODIUM CHLORIDE 0.9 % IR SOLN
Status: DC | PRN
  Administered 2015-11-26: 3000 mL

## 2015-11-26 MED ORDER — DOCUSATE SODIUM 100 MG PO CAPS: 100.0000 mg | ORAL_CAPSULE | Freq: Two times a day (BID) | ORAL | Status: DC | PRN

## 2015-11-26 MED ORDER — FENTANYL CITRATE (PF) 100 MCG/2ML IJ SOLN
INTRAMUSCULAR | Status: DC
Start: 2015-11-26 — End: 2015-11-26
  Filled 2015-11-26: qty 2

## 2015-11-26 MED ORDER — LIDOCAINE HCL (CARDIAC) 20 MG/ML IV SOLN
INTRAVENOUS | Status: DC | PRN
  Administered 2015-11-26: 80 mg via INTRAVENOUS

## 2015-11-26 MED ORDER — LACTATED RINGERS IV SOLN
INTRAVENOUS | Status: DC
  Administered 2015-11-26: 12:00:00 via INTRAVENOUS

## 2015-11-26 MED ORDER — ONDANSETRON HCL 4 MG/2ML IJ SOLN: 4.0000 mg | Freq: Once | INTRAMUSCULAR | Status: DC | PRN

## 2015-11-26 MED ORDER — DEXAMETHASONE SODIUM PHOSPHATE 10 MG/ML IJ SOLN
INTRAMUSCULAR | Status: DC | PRN
  Administered 2015-11-26: 8 mg via INTRAVENOUS

## 2015-11-26 MED ORDER — EPHEDRINE SULFATE 50 MG/ML IJ SOLN
INTRAMUSCULAR | Status: DC | PRN
  Administered 2015-11-26: 5 mg via INTRAVENOUS
  Administered 2015-11-26: 10 mg via INTRAVENOUS

## 2015-11-26 MED ORDER — FAMOTIDINE 20 MG PO TABS
20.0000 mg | ORAL_TABLET | Freq: Once | ORAL | Status: AC
  Administered 2015-11-26: 20 mg via ORAL

## 2015-11-26 MED ORDER — IBUPROFEN 800 MG PO TABS: 800.0000 mg | ORAL_TABLET | Freq: Three times a day (TID) | ORAL | Status: DC | PRN

## 2015-11-26 MED ORDER — PHENYLEPHRINE HCL 10 MG/ML IJ SOLN
INTRAMUSCULAR | Status: DC | PRN
  Administered 2015-11-26: 50 ug via INTRAVENOUS

## 2015-11-26 MED ORDER — ACETAMINOPHEN 10 MG/ML IV SOLN
INTRAVENOUS | Status: AC
  Filled 2015-11-26: qty 100

## 2015-11-26 MED ORDER — PROPOFOL 10 MG/ML IV BOLUS
INTRAVENOUS | Status: DC | PRN
  Administered 2015-11-26: 180 mg via INTRAVENOUS

## 2015-11-26 MED ORDER — ONDANSETRON 4 MG PO TBDP: 4.0000 mg | ORAL_TABLET | Freq: Four times a day (QID) | ORAL | Status: DC | PRN

## 2015-11-26 MED ORDER — MIDAZOLAM HCL 2 MG/2ML IJ SOLN
INTRAMUSCULAR | Status: DC | PRN
  Administered 2015-11-26: 2 mg via INTRAVENOUS

## 2015-11-26 MED ORDER — ONDANSETRON HCL 4 MG/2ML IJ SOLN
INTRAMUSCULAR | Status: DC | PRN
  Administered 2015-11-26: 4 mg via INTRAVENOUS

## 2015-11-26 MED ORDER — FENTANYL CITRATE (PF) 100 MCG/2ML IJ SOLN
INTRAMUSCULAR | Status: DC | PRN
  Administered 2015-11-26 (×4): 25 ug via INTRAVENOUS

## 2015-11-26 MED ORDER — LACTATED RINGERS IV SOLN: INTRAVENOUS | Status: DC

## 2015-11-26 MED ORDER — ACETAMINOPHEN 10 MG/ML IV SOLN
INTRAVENOUS | Status: DC | PRN
  Administered 2015-11-26: 1000 mg via INTRAVENOUS

## 2015-11-26 MED ORDER — GLYCOPYRROLATE 0.2 MG/ML IJ SOLN
INTRAMUSCULAR | Status: DC | PRN
  Administered 2015-11-26: 0.2 mg via INTRAVENOUS

## 2015-11-26 SURGICAL SUPPLY — 17 items
CANISTER SUC SOCK COL 7IN (MISCELLANEOUS) ×2 IMPLANT
CANISTER SUCT 3000ML (MISCELLANEOUS) ×2 IMPLANT
CATH ROBINSON RED A/P 16FR (CATHETERS) ×2 IMPLANT
DRESSING TELFA 4X3 1S ST N-ADH (GAUZE/BANDAGES/DRESSINGS) ×2 IMPLANT
GLOVE BIO SURGEON STRL SZ 6.5 (GLOVE) ×2 IMPLANT
GLOVE INDICATOR 7.0 STRL GRN (GLOVE) ×2 IMPLANT
GOWN STRL REUS W/ TWL LRG LVL3 (GOWN DISPOSABLE) ×2 IMPLANT
GOWN STRL REUS W/TWL LRG LVL3 (GOWN DISPOSABLE) ×2
KIT RM TURNOVER CYSTO AR (KITS) ×2 IMPLANT
MYOSURE LITE POLYP REMOVAL (MISCELLANEOUS) ×2 IMPLANT
PACK DNC HYST (MISCELLANEOUS) ×2 IMPLANT
PAD OB MATERNITY 4.3X12.25 (PERSONAL CARE ITEMS) ×2 IMPLANT
PAD PREP 24X41 OB/GYN DISP (PERSONAL CARE ITEMS) ×2 IMPLANT
SOL .9 NS 3000ML IRR  AL (IV SOLUTION) ×1
SOL .9 NS 3000ML IRR UROMATIC (IV SOLUTION) ×1 IMPLANT
TUBING CONNECTING 10 (TUBING) ×2 IMPLANT
TUBING HYSTEROSCOPY DOLPHIN (MISCELLANEOUS) ×2 IMPLANT

## 2015-11-26 NOTE — Transfer of Care (Signed)
Immediate Anesthesia Transfer of Care Note  Patient: Tonya Gregory  Procedure(s) Performed: Procedure(s): DILATATION & CURETTAGE/HYSTEROSCOPY WITH MYOSURE (N/A)  Patient Location: PACU  Anesthesia Type:General  Level of Consciousness: sedated  Airway & Oxygen Therapy: Patient Spontanous Breathing and Patient connected to face mask oxygen  Post-op Assessment: Report given to RN and Post -op Vital signs reviewed and stable  Post vital signs: Reviewed and stable  Last Vitals:  Filed Vitals:   11/26/15 1145 11/26/15 1356  BP: 141/78 150/95  Pulse: 64 72  Temp: 36.8 C 36.2 C  Resp: 16 12    Complications: No apparent anesthesia complications

## 2015-11-26 NOTE — Anesthesia Preprocedure Evaluation (Addendum)
Anesthesia Evaluation  Patient identified by MRN, date of birth, ID band Patient awake    Reviewed: Allergy & Precautions, NPO status , Patient's Chart, lab work & pertinent test results  History of Anesthesia Complications (+) PONV and Family history of anesthesia reaction  Airway Mallampati: II  TM Distance: >3 FB Neck ROM: Full    Dental  (+) Caps Implants:   Pulmonary asthma , former smoker,    Pulmonary exam normal        Cardiovascular hypertension, Normal cardiovascular exam     Neuro/Psych negative neurological ROS  negative psych ROS   GI/Hepatic negative GI ROS, Neg liver ROS,   Endo/Other  negative endocrine ROS  Renal/GU negative Renal ROS  negative genitourinary   Musculoskeletal negative musculoskeletal ROS (+)   Abdominal Normal abdominal exam  (+)   Peds negative pediatric ROS (+)  Hematology negative hematology ROS (+)   Anesthesia Other Findings   Reproductive/Obstetrics                            Anesthesia Physical Anesthesia Plan  ASA: II  Anesthesia Plan: General   Post-op Pain Management:    Induction: Intravenous  Airway Management Planned: LMA  Additional Equipment:   Intra-op Plan:   Post-operative Plan: Extubation in OR  Informed Consent: I have reviewed the patients History and Physical, chart, labs and discussed the procedure including the risks, benefits and alternatives for the proposed anesthesia with the patient or authorized representative who has indicated his/her understanding and acceptance.   Dental advisory given  Plan Discussed with: CRNA and Surgeon  Anesthesia Plan Comments:         Anesthesia Quick Evaluation

## 2015-11-26 NOTE — Anesthesia Procedure Notes (Signed)
Procedure Name: LMA Insertion Performed by: Anecia Nusbaum Pre-anesthesia Checklist: Patient identified, Patient being monitored, Timeout performed, Emergency Drugs available and Suction available Patient Re-evaluated:Patient Re-evaluated prior to inductionOxygen Delivery Method: Circle system utilized Preoxygenation: Pre-oxygenation with 100% oxygen Intubation Type: IV induction Ventilation: Mask ventilation without difficulty LMA: LMA inserted LMA Size: 4.0 Tube type: Oral Number of attempts: 1 Placement Confirmation: positive ETCO2 and breath sounds checked- equal and bilateral Tube secured with: Tape Dental Injury: Teeth and Oropharynx as per pre-operative assessment        

## 2015-11-26 NOTE — Op Note (Signed)
Operative Report Hysteroscopy with Dilation and Curettage   Indications: Postmenopausal bleeding   Pre-operative Diagnosis:  1. Endometrial polyp by biopsy   Post-operative Diagnosis: same.  Procedure: 1. Exam under anesthesia 2. Fractional D&C 3. Hysteroscopy  Surgeon: Benjaman Kindler, MD  Assistant(s):  None  Anesthesia: General endotracheal anesthesia  Anesthesiologist: Alvin Critchley, MD Anesthesiologist: Alvin Critchley, MD CRNA: Demetrius Charity, CRNA  Estimated Blood Loss:  Minimal         Intraoperative medications: toradol         Total IV Fluids: 665ml  Urine Output: 554ml  Total Fluid Deficit:  50 mL          Specimens: Endocervical curettings, endometrial curettings         Complications:  None; patient tolerated the procedure well.         Disposition: PACU - hemodynamically stable.         Condition: stable  Findings: Uterus measuring 10.5 cm by sound; normal cervix, vagina, perineum. Fluffy endometrial lining that looked estrogen primed. No visible endometrial polyps noted. On palpation during D&C, firm and bumpy endometrial cavity.  Indication for procedure/Consents: 55 y.o. G3P3 here for scheduled surgery for the aforementioned diagnoses.  She is not bleeding currently and is in no pain. Risks of surgery were discussed with the patient including but not limited to: bleeding which may require transfusion; infection which may require antibiotics; injury to uterus or surrounding organs; intrauterine scarring which may impair future fertility; need for additional procedures including laparotomy or laparoscopy; and other postoperative/anesthesia complications. Written informed consent was obtained.    Procedure Details:  Fractional D&C only  The patient was taken to the operating room where anesthesia was administered and was found to be adequate. After a formal and adequate timeout was performed, she was placed in the dorsal lithotomy position and examined  with the above findings. She was then prepped and draped in the sterile manner. Her bladder was catheterized for an estimated amount of clear, yellow urine. A weighed speculum was then placed in the patient's vagina and a single tooth tenaculum was applied to the anterior lip of the cervix.  Her cervix was serially dilated to 15 [place] using Hanks dilators. An ECC was performed. The hysteroscope was introduced to reveal the above findings. A sharp curettage was then performed until there was a gritty texture in all four quadrants. The tenaculum was removed from the anterior lip of the cervix and the vaginal speculum was removed after applying silver nitrate for good hemostasis.   The patient tolerated the procedure well and was taken to the recovery area awake and in stable condition. She received iv acetaminophen and Toradol prior to leaving the OR.  The patient will be discharged to home as per PACU criteria. Routine postoperative instructions given. She was prescribed Ibuprofen and Colace. She will follow up in the clinic in two weeks for postoperative evaluation.

## 2015-11-26 NOTE — Interval H&P Note (Signed)
History and Physical Interval Note:  11/26/2015 12:13 PM  Tonya Gregory  has presented today for surgery, with the diagnosis of PMB,  Endometrial Polyp, thickened endometrial stripe in menopausal period.  The various methods of treatment have been discussed with the patient and family. After consideration of risks, benefits and other options for treatment, the patient has consented to  Procedure(s): Boutte (N/A) as a surgical intervention .  The patient's history has been reviewed, patient examined, no change in status, stable for surgery.  I have reviewed the patient's chart and labs.  Questions were answered to the patient's satisfaction.     Benjaman Kindler

## 2015-11-26 NOTE — Discharge Instructions (Signed)

## 2015-11-27 LAB — SURGICAL PATHOLOGY

## 2015-11-27 NOTE — Anesthesia Postprocedure Evaluation (Signed)
Anesthesia Post Note  Patient: Tonya Gregory  Procedure(s) Performed: Procedure(s) (LRB): DILATATION & CURETTAGE/HYSTEROSCOPY WITH MYOSURE (N/A)  Patient location during evaluation: PACU Anesthesia Type: General Level of consciousness: awake and alert and oriented Pain management: pain level controlled Vital Signs Assessment: post-procedure vital signs reviewed and stable Respiratory status: spontaneous breathing Cardiovascular status: blood pressure returned to baseline Anesthetic complications: no    Last Vitals:  Filed Vitals:   11/26/15 1426 11/26/15 1434  BP: 145/91 162/93  Pulse: 74 64  Temp: 36.1 C 36.4 C  Resp:  14    Last Pain:  Filed Vitals:   11/26/15 1441  PainSc: 2                  Feliberto Stockley

## 2016-10-15 ENCOUNTER — Other Ambulatory Visit: Payer: Self-pay | Admitting: Obstetrics and Gynecology

## 2016-10-15 DIAGNOSIS — Z1231 Encounter for screening mammogram for malignant neoplasm of breast: Secondary | ICD-10-CM

## 2016-11-10 ENCOUNTER — Ambulatory Visit
Admission: RE | Admit: 2016-11-10 | Discharge: 2016-11-10 | Disposition: A | Discharge status: Home / Self Care | Payer: PRIVATE HEALTH INSURANCE | Source: Ambulatory Visit | Attending: Obstetrics and Gynecology | Admitting: Obstetrics and Gynecology

## 2016-11-10 DIAGNOSIS — Z1231 Encounter for screening mammogram for malignant neoplasm of breast: Secondary | ICD-10-CM

## 2017-08-04 DIAGNOSIS — Z90710 Acquired absence of both cervix and uterus: Secondary | ICD-10-CM

## 2017-08-04 HISTORY — DX: Acquired absence of both cervix and uterus: Z90.710

## 2017-08-18 ENCOUNTER — Encounter
Admission: RE | Admit: 2017-08-18 | Discharge: 2017-08-18 | Disposition: A | Discharge status: Home / Self Care | Payer: Self-pay | Source: Ambulatory Visit | Attending: Obstetrics and Gynecology | Admitting: Obstetrics and Gynecology

## 2017-08-18 ENCOUNTER — Other Ambulatory Visit: Payer: Self-pay

## 2017-08-18 HISTORY — DX: Hypothyroidism, unspecified: E03.9

## 2017-08-18 HISTORY — DX: Respiratory tuberculosis unspecified: A15.9

## 2017-08-18 NOTE — H&P (Signed)
Patient ID: Tonya Gregory is a 57 y.o. female presenting with Pre-operative Evaluation  on 08/17/2017  Patient presents for preop for persistent intermittent postmenopausal bleeding:  This has been happening for a while, and workup last year showed:   S/p D&C 12/2015 for PMB with findings of endometrial polyp, no malignancy.  EMBx: Diagnosis:  ENDOMETRIUM, BIOPSY:  BENIGN ENDOMETRIAL POLYP.  Pap smear: 12/18- ASCUS with neg HPV  She is doing hormone pellets with testosterone, estrogen and is taking oral progesterone by mouth. Her dose increased from 100mg  nightly to 200mg  nightly when she developed postmenopausal bleeding.   She is interested in definitive management  Past Medical History:  has a past medical history of Asthma without status asthmaticus, unspecified.  Past Surgical History:  has a past surgical history that includes Colonoscopy; Dilation and curettage of uterus (11/26/2015); and Hysteroscopy (11/26/2015). Family History: family history includes Colon polyps in her mother; Stroke in her mother. Social History:  reports that she has never smoked. She has never used smokeless tobacco. She reports that she drinks alcohol. She reports that she does not use drugs. OB/GYN History:          OB History    Gravida  3   Para  3   Term  3   Preterm      AB      Living  3     SAB      TAB      Ectopic      Molar      Multiple      Live Births             Allergies: is allergic to ace inhibitors. Medications:  Current Outpatient Medications:  .  albuterol 90 mcg/actuation inhaler, Inhale 2 inhalations into the lungs 4 (four) times daily as needed for Wheezing., Disp: , Rfl:  .  aspirin 81 MG EC tablet, Take 81 mg by mouth once daily. Reported on 10/30/2015 , Disp: , Rfl:  .  lisinopril (PRINIVIL,ZESTRIL) 10 MG tablet, Take 10 mg by mouth once daily. Reported on 10/30/2015 , Disp: , Rfl:    Review of Systems: No SOB, no palpitations  or chest pain, no new lower extremity edema, no nausea or vomiting or bowel or bladder complaints. See HPI for gyn specific ROS.   Exam:   BP 131/74   Ht 175.3 cm (5\' 9" )   Wt 82.4 kg (181 lb 9.6 oz)   BMI 26.82 kg/m   General: Patient is well-groomed, well-nourished, appears stated age in no acute distress  HEENT: head is atraumatic and normocephalic, trachea is midline, neck is supple with no palpable nodules  CV: Regular rhythm and normal heart rate, no murmur  Pulm: Clear to auscultation throughout lung fields with no wheezing, crackles, or rhonchi. No increased work of breathing  Abdomen: soft , no mass, non-tender, no rebound tenderness, no hepatomegaly  Pelvic: deferred   Impression:   The encounter diagnosis was PMB (postmenopausal bleeding).    Plan:   Patient returns for a preoperative discussion regarding her plans to proceed with surgical treatment of her endometrial polyp by total vaginal hysterectomy with bilateral salpingectomy procedure.   The patient and I discussed the technical aspects of the procedure including the potential for risks and complications. These include but are not limited to the risk of infection requiring post-operative antibiotics or further procedures. We talked about the risk of injury to adjacent organs including bladder, bowel, ureter, blood vessels or nerves. We  talked about the need to convert to an open incision. We talked about the possible need for blood transfusion. We talked aboutpostop complications such asthromboembolic or cardiopulmonary complications. All of her questions were answered.  Her preoperative exam was completed and the appropriate consents were signed. She is scheduled to undergo this procedure in the near future.  Specific Peri-operative Considerations:  - Consent: obtained today - Health Maintenance: up to date - Labs: CBC, CMP preoperatively - Studies: EKG, CXR preoperatively - Bowel  Preparation: None required - Abx:  Cefoxitin 2g - VTE ppx: SCDs perioperatively - Glucose Protocol: n/a - Beta-blockade: n/a

## 2017-08-18 NOTE — Patient Instructions (Addendum)
Your procedure is scheduled on: 08-31-17 MONDAY Report to Same Day Surgery 2nd floor medical mall [facility] Entrance-take elevator on left to 2nd floor.  Check in with surgery information desk.) To find out your arrival time please call [phone] between 1PM - 3PM on 08-28-17 FRIDAY  Remember: Instructions that are not followed completely may result in serious medical risk, up to and including death, or upon the discretion of your surgeon and anesthesiologist your surgery may need to be rescheduled.    _x___ 1. Do not eat food after midnight the night before your procedure. NO GUM OR CANDY AFTER MIDNIGHT.  You may drink clear liquids up to 2 hours before you are scheduled to arrive at the hospital for your procedure.  Do not drink clear liquids within 2 hours of your scheduled arrival to the hospital.  Clear liquids include  --Water or Apple juice without pulp  --Clear carbohydrate beverage such as ClearFast or Gatorade  --Black Coffee or Clear Tea (No milk, no creamers, do not add anything to the coffee or Tea     __x__ 2. No Alcohol for 24 hours before or after surgery.   __x__3. No Smoking for 24 prior to surgery.   ____  4. Bring all medications with you on the day of surgery if instructed.    __x__ 5. Notify your doctor if there is any change in your medical condition     (cold, fever, infections).     Do not wear jewelry, make-up, hairpins, clips or nail polish.  Do not wear lotions, powders, or perfumes. You may wear deodorant.  Do not shave 48 hours prior to surgery. Men may shave face and neck.  Do not bring valuables to the hospital.    [facility] is not responsible for any belongings or valuables.               Contacts, dentures or bridgework may not be worn into surgery.  Leave your suitcase in the car. After surgery it may be brought to your room.  For patients admitted to the hospital, discharge time is determined by your treatment team.   Patients discharged  the day of surgery will not be allowed to drive home.  You will need someone to drive you home and stay with you the night of your procedure.    Please read over the following fact sheets that you were given:   [facility] Preparing for Surgery and or MRSA Information   _x___ TAKE THE FOLLOWING MEDICATION THE MORNING OF SURGERY WITH A SMALL SIP OF WATER. These include:  1. THYROID MED  2.  3.  4.  5.  6.  ____Fleets enema or Magnesium Citrate as directed.   ____ Use CHG Soap or sage wipes as directed on instruction sheet   _x___ Use inhalers on the day of surgery and bring to hospital day of surgery-USE ALBUTEROL INHALER AT [place]   ____ Stop Metformin and Janumet 2 days prior to surgery.    ____ Take 1/2 of usual insulin dose the night before surgery and none on the morning surgery.   _x___ Follow recommendations from Cardiologist, Pulmonologist or PCP regarding stopping Aspirin, Coumadin, Plavix ,Eliquis, Effient, or Pradaxa, and Pletal-STOP ASPIRIN 7 DAYS PRIOR TO SURGERY  X____Stop Anti-inflammatories such as Advil, Aleve, IBUPROFEN, Motrin, Naproxen, Naprosyn, Goodies powders or aspirin products 7 DAYS PRIOR TO SURGERY-OK to take Tylenol    _x___ Stop supplements until after surgery-STOP GLUCOSAMINE AND  FISH OIL 7 DAYS PRIOR TO SURGERY   ____ Bring C-Pap to the hospital.

## 2017-08-25 ENCOUNTER — Encounter
Admission: RE | Admit: 2017-08-25 | Discharge: 2017-08-25 | Disposition: A | Discharge status: Home / Self Care | Payer: Self-pay | Source: Ambulatory Visit | Attending: Obstetrics and Gynecology | Admitting: Obstetrics and Gynecology

## 2017-08-25 DIAGNOSIS — Z01818 Encounter for other preprocedural examination: Secondary | ICD-10-CM | POA: Insufficient documentation

## 2017-08-25 LAB — BASIC METABOLIC PANEL
Anion gap: 8 (ref 5–15)
BUN: 14 mg/dL (ref 6–20)
CO2: 26 mmol/L (ref 22–32)
Calcium: 9.5 mg/dL (ref 8.9–10.3)
Chloride: 105 mmol/L (ref 101–111)
Creatinine, Ser: 0.8 mg/dL (ref 0.44–1.00)
GFR calc Af Amer: >60 mL/min (ref >60)
GFR calc non Af Amer: >60 mL/min (ref >60)
Glucose, Bld: 107 mg/dL — ABNORMAL HIGH (ref 65–99)
Potassium: 4.6 mmol/L (ref 3.5–5.1)
Sodium: 139 mmol/L (ref 135–145)

## 2017-08-25 LAB — TYPE AND SCREEN
ABO/RH(D): O POS
Antibody Screen: NEGATIVE

## 2017-08-25 LAB — CBC
HCT: 42.5 % (ref 35.0–47.0)
Hemoglobin: 14.4 g/dL (ref 12.0–16.0)
MCH: 31.9 pg (ref 26.0–34.0)
MCHC: 33.8 g/dL (ref 32.0–36.0)
MCV: 94.2 fL (ref 80.0–100.0)
Platelets: 177 10*3/uL (ref 150–440)
RBC: 4.51 MIL/uL (ref 3.80–5.20)
RDW: 12.7 % (ref 11.5–14.5)
WBC: 4.9 10*3/uL (ref 3.6–11.0)

## 2017-08-25 NOTE — OR Nursing (Addendum)
Patient here for preop blood work.  Provided her with instruction sheet for preparing for surgery.  Gave instructions for use of inspirometer. Patient able to properly use the device .  Patient is a "self pay" and refused an ekg.  She felt that she has no medical problems requiring this to be completed and that it was financially not acceptable.

## 2017-08-27 ENCOUNTER — Encounter: Payer: Self-pay | Admitting: *Deleted

## 2017-08-28 ENCOUNTER — Encounter: Payer: Self-pay | Admitting: *Deleted

## 2017-08-30 MED ORDER — CEFAZOLIN SODIUM-DEXTROSE 2-4 GM/100ML-% IV SOLN
2.0000 g | INTRAVENOUS | Status: AC
  Administered 2017-08-31: 2 g via INTRAVENOUS

## 2017-08-31 ENCOUNTER — Ambulatory Visit: Payer: Self-pay | Admitting: Anesthesiology

## 2017-08-31 ENCOUNTER — Encounter: Payer: Self-pay | Admitting: *Deleted

## 2017-08-31 ENCOUNTER — Encounter
Admission: RE | Disposition: A | Discharge status: Home / Self Care | Payer: Self-pay | Source: Ambulatory Visit | Attending: Obstetrics and Gynecology

## 2017-08-31 ENCOUNTER — Ambulatory Visit
Admission: RE | Admit: 2017-08-31 | Discharge: 2017-08-31 | Disposition: A | Discharge status: Home / Self Care | Payer: PRIVATE HEALTH INSURANCE | Source: Ambulatory Visit | Attending: Obstetrics and Gynecology | Admitting: Obstetrics and Gynecology

## 2017-08-31 DIAGNOSIS — D259 Leiomyoma of uterus, unspecified: Secondary | ICD-10-CM | POA: Insufficient documentation

## 2017-08-31 DIAGNOSIS — N800 Endometriosis of uterus: Secondary | ICD-10-CM | POA: Insufficient documentation

## 2017-08-31 DIAGNOSIS — N950 Postmenopausal bleeding: Secondary | ICD-10-CM | POA: Insufficient documentation

## 2017-08-31 DIAGNOSIS — Z79899 Other long term (current) drug therapy: Secondary | ICD-10-CM | POA: Insufficient documentation

## 2017-08-31 DIAGNOSIS — J45909 Unspecified asthma, uncomplicated: Secondary | ICD-10-CM | POA: Insufficient documentation

## 2017-08-31 DIAGNOSIS — N888 Other specified noninflammatory disorders of cervix uteri: Secondary | ICD-10-CM | POA: Insufficient documentation

## 2017-08-31 DIAGNOSIS — Z7982 Long term (current) use of aspirin: Secondary | ICD-10-CM | POA: Insufficient documentation

## 2017-08-31 HISTORY — PX: BILATERAL SALPINGECTOMY: SHX5743

## 2017-08-31 HISTORY — PX: VAGINAL HYSTERECTOMY: SHX2639

## 2017-08-31 HISTORY — DX: Acquired absence of both cervix and uterus: Z90.710

## 2017-08-31 SURGERY — HYSTERECTOMY, VAGINAL
Anesthesia: General

## 2017-08-31 MED ORDER — ACETAMINOPHEN NICU IV SYRINGE 10 MG/ML
INTRAVENOUS | Status: AC
  Filled 2017-08-31: qty 1

## 2017-08-31 MED ORDER — FENTANYL CITRATE (PF) 100 MCG/2ML IJ SOLN
INTRAMUSCULAR | Status: AC
  Filled 2017-08-31: qty 2

## 2017-08-31 MED ORDER — IBUPROFEN 800 MG PO TABS
ORAL_TABLET | ORAL | Status: AC
  Filled 2017-08-31: qty 1

## 2017-08-31 MED ORDER — LIDOCAINE-EPINEPHRINE 1 %-1:100000 IJ SOLN
INTRAMUSCULAR | Status: DC | PRN
  Administered 2017-08-31: 16 mL

## 2017-08-31 MED ORDER — ACETAMINOPHEN 500 MG PO TABS: 1000.0000 mg | ORAL_TABLET | Freq: Four times a day (QID) | ORAL | 0 refills | Status: AC

## 2017-08-31 MED ORDER — ACETAMINOPHEN 10 MG/ML IV SOLN
INTRAVENOUS | Status: DC | PRN
  Administered 2017-08-31: 1000 mg via INTRAVENOUS

## 2017-08-31 MED ORDER — LACTATED RINGERS IV SOLN: INTRAVENOUS | Status: DC

## 2017-08-31 MED ORDER — ONDANSETRON HCL 4 MG/2ML IJ SOLN: 4.0000 mg | Freq: Once | INTRAMUSCULAR | Status: DC | PRN

## 2017-08-31 MED ORDER — PROPOFOL 10 MG/ML IV BOLUS
INTRAVENOUS | Status: DC | PRN
  Administered 2017-08-31: 150 mg via INTRAVENOUS

## 2017-08-31 MED ORDER — ESTROGENS, CONJUGATED 0.625 MG/GM VA CREA
TOPICAL_CREAM | VAGINAL | Status: DC | PRN
  Administered 2017-08-31: 1 via VAGINAL

## 2017-08-31 MED ORDER — FAMOTIDINE 20 MG PO TABS
ORAL_TABLET | ORAL | Status: AC
  Administered 2017-08-31: 20 mg
  Filled 2017-08-31: qty 1

## 2017-08-31 MED ORDER — LIDOCAINE HCL (CARDIAC) 20 MG/ML IV SOLN
INTRAVENOUS | Status: DC | PRN
  Administered 2017-08-31: 80 mg via INTRAVENOUS

## 2017-08-31 MED ORDER — SCOPOLAMINE 1 MG/3DAYS TD PT72
MEDICATED_PATCH | TRANSDERMAL | Status: AC
  Filled 2017-08-31: qty 1

## 2017-08-31 MED ORDER — SUCCINYLCHOLINE CHLORIDE 20 MG/ML IJ SOLN
INTRAMUSCULAR | Status: AC
  Filled 2017-08-31: qty 1

## 2017-08-31 MED ORDER — SODIUM CHLORIDE FLUSH 0.9 % IV SOLN
INTRAVENOUS | Status: AC
  Filled 2017-08-31: qty 10

## 2017-08-31 MED ORDER — MIDAZOLAM HCL 2 MG/2ML IJ SOLN
INTRAMUSCULAR | Status: AC
  Filled 2017-08-31: qty 2

## 2017-08-31 MED ORDER — GLYCOPYRROLATE 0.2 MG/ML IJ SOLN
INTRAMUSCULAR | Status: DC | PRN
  Administered 2017-08-31 (×2): 0.2 mg via INTRAVENOUS

## 2017-08-31 MED ORDER — ESTROGENS, CONJUGATED 0.625 MG/GM VA CREA
TOPICAL_CREAM | VAGINAL | Status: AC
  Filled 2017-08-31: qty 30

## 2017-08-31 MED ORDER — SCOPOLAMINE 1 MG/3DAYS TD PT72
1.0000 | MEDICATED_PATCH | TRANSDERMAL | Status: DC
  Administered 2017-08-31: 1.5 mg via TRANSDERMAL

## 2017-08-31 MED ORDER — MIDAZOLAM HCL 2 MG/2ML IJ SOLN
INTRAMUSCULAR | Status: DC | PRN
  Administered 2017-08-31: 2 mg via INTRAVENOUS

## 2017-08-31 MED ORDER — SUGAMMADEX SODIUM 200 MG/2ML IV SOLN
INTRAVENOUS | Status: DC | PRN
  Administered 2017-08-31: 175 mg via INTRAVENOUS

## 2017-08-31 MED ORDER — FENTANYL CITRATE (PF) 100 MCG/2ML IJ SOLN
INTRAMUSCULAR | Status: AC
  Administered 2017-08-31: 25 ug via INTRAVENOUS
  Filled 2017-08-31: qty 2

## 2017-08-31 MED ORDER — PROPOFOL 10 MG/ML IV BOLUS
INTRAVENOUS | Status: AC
  Filled 2017-08-31: qty 20

## 2017-08-31 MED ORDER — IBUPROFEN 800 MG PO TABS: 800.0000 mg | ORAL_TABLET | Freq: Three times a day (TID) | ORAL | 1 refills | Status: DC | PRN

## 2017-08-31 MED ORDER — ROCURONIUM BROMIDE 50 MG/5ML IV SOLN
INTRAVENOUS | Status: AC
  Filled 2017-08-31: qty 1

## 2017-08-31 MED ORDER — LACTATED RINGERS IV SOLN
INTRAVENOUS | Status: DC
  Administered 2017-08-31: 09:00:00 via INTRAVENOUS

## 2017-08-31 MED ORDER — OXYCODONE HCL 5 MG PO CAPS: 5.0000 mg | ORAL_CAPSULE | Freq: Four times a day (QID) | ORAL | 0 refills | Status: DC | PRN

## 2017-08-31 MED ORDER — GABAPENTIN 800 MG PO TABS: 800.0000 mg | ORAL_TABLET | Freq: Every day | ORAL | 2 refills | Status: DC

## 2017-08-31 MED ORDER — KETOROLAC TROMETHAMINE 30 MG/ML IJ SOLN
INTRAMUSCULAR | Status: DC | PRN
  Administered 2017-08-31: 30 mg via INTRAVENOUS

## 2017-08-31 MED ORDER — ONDANSETRON HCL 4 MG/2ML IJ SOLN
INTRAMUSCULAR | Status: DC | PRN
  Administered 2017-08-31: 4 mg via INTRAVENOUS

## 2017-08-31 MED ORDER — DOCUSATE SODIUM 100 MG PO CAPS: 100.0000 mg | ORAL_CAPSULE | Freq: Two times a day (BID) | ORAL | 0 refills | Status: DC

## 2017-08-31 MED ORDER — CEFAZOLIN SODIUM-DEXTROSE 2-4 GM/100ML-% IV SOLN
INTRAVENOUS | Status: AC
  Filled 2017-08-31: qty 100

## 2017-08-31 MED ORDER — LIDOCAINE HCL (PF) 2 % IJ SOLN
INTRAMUSCULAR | Status: AC
  Filled 2017-08-31: qty 10

## 2017-08-31 MED ORDER — FAMOTIDINE 20 MG PO TABS: 20.0000 mg | ORAL_TABLET | Freq: Once | ORAL | Status: DC

## 2017-08-31 MED ORDER — IBUPROFEN 800 MG PO TABS
800.0000 mg | ORAL_TABLET | Freq: Three times a day (TID) | ORAL | Status: DC | PRN
  Administered 2017-08-31: 800 mg via ORAL
  Filled 2017-08-31: qty 1

## 2017-08-31 MED ORDER — DEXAMETHASONE SODIUM PHOSPHATE 10 MG/ML IJ SOLN
INTRAMUSCULAR | Status: DC | PRN
  Administered 2017-08-31: 6 mg via INTRAVENOUS

## 2017-08-31 MED ORDER — FENTANYL CITRATE (PF) 100 MCG/2ML IJ SOLN
25.0000 ug | INTRAMUSCULAR | Status: DC | PRN
  Administered 2017-08-31 (×4): 25 ug via INTRAVENOUS

## 2017-08-31 MED ORDER — FENTANYL CITRATE (PF) 100 MCG/2ML IJ SOLN
INTRAMUSCULAR | Status: DC | PRN
  Administered 2017-08-31: 100 ug via INTRAVENOUS
  Administered 2017-08-31 (×2): 50 ug via INTRAVENOUS

## 2017-08-31 MED ORDER — ROCURONIUM BROMIDE 100 MG/10ML IV SOLN
INTRAVENOUS | Status: DC | PRN
  Administered 2017-08-31: 50 mg via INTRAVENOUS

## 2017-08-31 SURGICAL SUPPLY — 31 items
BAG URINE DRAINAGE (UROLOGICAL SUPPLIES) ×3 IMPLANT
CANISTER SUCT 1200ML W/VALVE (MISCELLANEOUS) ×3 IMPLANT
CATH FOLEY 2WAY  5CC 16FR (CATHETERS) ×1
CATH URTH 16FR FL 2W BLN LF (CATHETERS) ×2 IMPLANT
COUNTER NEEDLE 20/40 LG (NEEDLE) ×3 IMPLANT
DRAPE PERI LITHO V/GYN (MISCELLANEOUS) ×3 IMPLANT
DRAPE SURG 17X11 SM STRL (DRAPES) ×3 IMPLANT
DRAPE UNDER BUTTOCK W/FLU (DRAPES) ×3 IMPLANT
ELECT REM PT RETURN 9FT ADLT (ELECTROSURGICAL) ×3
ELECTRODE REM PT RTRN 9FT ADLT (ELECTROSURGICAL) ×2 IMPLANT
GLOVE BIO SURGEON STRL SZ7 (GLOVE) ×15 IMPLANT
GLOVE INDICATOR 7.5 STRL GRN (GLOVE) ×15 IMPLANT
GOWN STRL REUS W/ TWL LRG LVL3 (GOWN DISPOSABLE) ×6 IMPLANT
GOWN STRL REUS W/ TWL XL LVL3 (GOWN DISPOSABLE) ×2 IMPLANT
GOWN STRL REUS W/TWL LRG LVL3 (GOWN DISPOSABLE) ×3
GOWN STRL REUS W/TWL XL LVL3 (GOWN DISPOSABLE) ×1
KIT TURNOVER CYSTO (KITS) ×3 IMPLANT
LABEL OR SOLS (LABEL) ×3 IMPLANT
NEEDLE HYPO 22GX1.5 SAFETY (NEEDLE) ×3 IMPLANT
PACK BASIN MINOR ARMC (MISCELLANEOUS) ×3 IMPLANT
PAD OB MATERNITY 4.3X12.25 (PERSONAL CARE ITEMS) ×3 IMPLANT
PAD PREP 24X41 OB/GYN DISP (PERSONAL CARE ITEMS) ×3 IMPLANT
SUT PDS 2-0 27IN (SUTURE) ×3 IMPLANT
SUT VIC AB 0 CT1 27 (SUTURE) ×3
SUT VIC AB 0 CT1 27XCR 8 STRN (SUTURE) ×6 IMPLANT
SUT VIC AB 0 CT1 36 (SUTURE) ×6 IMPLANT
SUT VIC AB 2-0 SH 27 (SUTURE) ×4
SUT VIC AB 2-0 SH 27XBRD (SUTURE) ×8 IMPLANT
SYR 10ML LL (SYRINGE) ×3 IMPLANT
SYR CONTROL 10ML (SYRINGE) ×3 IMPLANT
WATER STERILE IRR 1000ML POUR (IV SOLUTION) ×3 IMPLANT

## 2017-08-31 NOTE — OR Nursing (Signed)
Dr. Leafy Ro in to see pt and removed foley cath and vaginal packing.  Patient tolerated both well.  Premarin cream given to pt by MD (was in pt's chart) and she instructed her on use.

## 2017-08-31 NOTE — Anesthesia Procedure Notes (Signed)
Procedure Name: Intubation Date/Time: 08/31/2017 9:57 AM Performed by: Eben Burow, CRNA Pre-anesthesia Checklist: Patient identified, Emergency Drugs available, Suction available, Patient being monitored and Timeout performed Patient Re-evaluated:Patient Re-evaluated prior to induction Oxygen Delivery Method: Circle system utilized Preoxygenation: Pre-oxygenation with 100% oxygen Induction Type: IV induction Ventilation: Mask ventilation without difficulty Laryngoscope Size: Mac and 3 Grade View: Grade I Tube type: Oral Tube size: 7.0 mm Number of attempts: 1 Airway Equipment and Method: Stylet Placement Confirmation: ETT inserted through vocal cords under direct vision,  positive ETCO2 and breath sounds checked- equal and bilateral Secured at: 22 cm Tube secured with: Tape Dental Injury: Teeth and Oropharynx as per pre-operative assessment

## 2017-08-31 NOTE — Anesthesia Post-op Follow-up Note (Signed)
Anesthesia QCDR form completed.        

## 2017-08-31 NOTE — Anesthesia Preprocedure Evaluation (Signed)
Anesthesia Evaluation  Patient identified by MRN, date of birth, ID band Patient awake    Reviewed: Allergy & Precautions, NPO status , Patient's Chart, lab work & pertinent test results  History of Anesthesia Complications (+) PONV and Family history of anesthesia reaction  Airway Mallampati: II  TM Distance: >3 FB Neck ROM: Full    Dental  (+) Caps Implants:   Pulmonary asthma , former smoker,    Pulmonary exam normal        Cardiovascular hypertension, Normal cardiovascular exam     Neuro/Psych negative neurological ROS  negative psych ROS   GI/Hepatic negative GI ROS, Neg liver ROS,   Endo/Other  negative endocrine ROS  Renal/GU negative Renal ROS  negative genitourinary   Musculoskeletal negative musculoskeletal ROS (+)   Abdominal Normal abdominal exam  (+)   Peds negative pediatric ROS (+)  Hematology negative hematology ROS (+)   Anesthesia Other Findings   Reproductive/Obstetrics                             Anesthesia Physical  Anesthesia Plan  ASA: II  Anesthesia Plan: General   Post-op Pain Management:    Induction: Intravenous  PONV Risk Score and Plan:   Airway Management Planned: Oral ETT  Additional Equipment:   Intra-op Plan:   Post-operative Plan: Extubation in OR  Informed Consent: I have reviewed the patients History and Physical, chart, labs and discussed the procedure including the risks, benefits and alternatives for the proposed anesthesia with the patient or authorized representative who has indicated his/her understanding and acceptance.   Dental advisory given  Plan Discussed with: CRNA and Surgeon  Anesthesia Plan Comments:         Anesthesia Quick Evaluation

## 2017-08-31 NOTE — Interval H&P Note (Signed)
History and Physical Interval Note:  08/31/2017 9:28 AM  Tonya Gregory  has presented today for surgery, with the diagnosis of Persistent Postmenopausal bleeding  The various methods of treatment have been discussed with the patient and family. After consideration of risks, benefits and other options for treatment, the patient has consented to  Procedure(s): HYSTERECTOMY VAGINAL (N/A) BILATERAL SALPINGECTOMY (Bilateral) as a surgical intervention .  The patient's history has been reviewed, patient examined, no change in status, stable for surgery.  I have reviewed the patient's chart and labs.  Questions were answered to the patient's satisfaction.     Benjaman Kindler

## 2017-08-31 NOTE — Transfer of Care (Signed)
Immediate Anesthesia Transfer of Care Note  Patient: Tonya Gregory  Procedure(s) Performed: HYSTERECTOMY VAGINAL (N/A ) BILATERAL SALPINGECTOMY (Bilateral )  Patient Location: PACU  Anesthesia Type:General  Level of Consciousness: awake and alert   Airway & Oxygen Therapy: Patient Spontanous Breathing and Patient connected to face mask oxygen  Post-op Assessment: Report given to RN and Post -op Vital signs reviewed and stable  Post vital signs: Reviewed and stable  Last Vitals:  Vitals:   08/31/17 0810 08/31/17 1147  BP: 117/66 118/68  Pulse: 62 79  Resp: 16 13  Temp: 36.7 C 36.5 C  SpO2: 100% 100%    Last Pain:  Vitals:   08/31/17 1147  TempSrc: Temporal         Complications: No apparent anesthesia complications

## 2017-08-31 NOTE — Anesthesia Postprocedure Evaluation (Signed)
Anesthesia Post Note  Patient: [name]  Procedure(s) Performed: HYSTERECTOMY VAGINAL (N/A ) BILATERAL SALPINGECTOMY (Bilateral )  Anesthesia Type: General Level of consciousness: awake and alert and oriented Pain management: pain level controlled Vital Signs Assessment: post-procedure vital signs reviewed and stable Respiratory status: spontaneous breathing Cardiovascular status: blood pressure returned to baseline Anesthetic complications: no     Last Vitals:  Vitals:   08/31/17 1252 08/31/17 1408  BP: 116/65 135/79  Pulse: 60 60  Resp: 14 14  Temp: 36.7 C   SpO2: 100% 100%    Last Pain:  Vitals:   08/31/17 1408  TempSrc:   PainSc: 2                  Acea Yagi

## 2017-08-31 NOTE — Op Note (Signed)
Tonya Gregory PROCEDURE DATE: 08/31/2017  PREOPERATIVE DIAGNOSIS:   Postmenopausal bleeding, fibroids POSTOPERATIVE DIAGNOSIS:   Same  SURGEON:   Benjaman Kindler, M.D. ASSISTANT: Boykin Nearing, M.D. OPERATION:  Total Vaginal Hysterectomy, bilateral salpingectomy ANESTHESIA:  General endotracheal. Anesthesiologist: Anesthesiologist: Alvin Critchley, MD CRNA: Eben Burow, CRNA; Johnna Acosta, CRNA  INDICATIONS: The patient is a 57 y.o. F with history of postmenopausal bleeding. The patient made a decision to undergo definite surgical treatment. On the preoperative visit, the risks, benefits, indications, and alternatives of the procedure were reviewed with the patient.  On the day of surgery, the risks of surgery were again discussed with the patient including but not limited to: bleeding which may require transfusion or reoperation; infection which may require antibiotics; injury to bowel, bladder, ureters or other surrounding organs; need for additional procedures; thromboembolic phenomenon, incisional problems and other postoperative/anesthesia complications. Written informed consent was obtained.    OPERATIVE FINDINGS: A 8 week size uterus with normal tubes and ovaries bilaterally.  ESTIMATED BLOOD LOSS: 100 ml FLUIDS:  800 ml of Lactated Ringers URINE OUTPUT:  400 ml of clear yellow urine. SPECIMENS:  Uterus and cervix and tubes sent to pathology COMPLICATIONS:  None immediate.  DESCRIPTION OF PROCEDURE:  The patient received prophylactic intravenous antibiotics and had sequential compression devices applied to her lower extremities while in the preoperative area.    She was taken to the operating room, where she was identified by name and birth date. General anesthesia was administered and was found to be adequate.  She was placed in the dorsal lithotomy position, and was prepped and draped in a sterile manner.  A formal time out procedure was performed with all team  members present and in agreement. A Foley catheter was inserted into her bladder and attached to gravity drainage. Attention was turned to her pelvis. Of note, all sutures used in this case were 0 Vicryl unless otherwise noted.     A weighted speculum was placed in the vagina, and the anterior and posterior lips of the cervix were grasped bilaterally with thyroid tenaculums.  The cervix was then injected circumferentially with 0.25% Marcaine with epinephrine solution to maintain hemostasis.  The cervix was circumferentially incised using electrocautery, and the posterior cul-de-sac was entered sharply in the midline.   A long weighted speculum was inserted into the posterior cul-de-sac. The bladder was dissected off the pubocervical fascia anteriorly with sharp and careful blunt dissection without complication.  The anterior cul-de-sac was then entered sharply without difficulty and the bladder retracted out of the operative field behind a retractor. The Heaney clamp was then used to clamp the uterosacral ligaments on either side.  They were then cut and sutured ligated with 0 Vicryl, and the ligated uterosacral ligaments were transfixed to the ipsilateral vaginal epithelium to further support the vagina and provide hemostasis. The cardinal ligaments were then clamped, cut and ligated. The uterine vessels and broad ligaments were then serially clamped with the Heaney clamps, cut, and suture ligated on both sides.  Excellent hemostasis was noted at this point.    The uterus was then delivered via the posterior cul-de-sac, and the cornua were clamped with the Heaney clamps, transected, and the uterine specimen was delivered and sent to pathology. These pedicles were then suture ligated to ensure hemostasis.   BILATERAL SALPINGECTOMY PARAGRAPH The Fallopian tubes were then identified and individually grasped with Babcock clamps. They were clamped across entirely with a Heaney clamp and free-tied, followed by  suture ligation for  excellent hemostasis bilaterally. The ovaries were left intact and in place.  After completion of the hysterectomy, all pedicles from the uterosacral ligament to the cornua were examined hemostasis was confirmed.  The peritoneum was closed in a purse string fashion with 2-0 PDS, taking care not to incorporate any intraabdominal organs in the closure.The vaginal cuff was then closed with in a running locked fashion with care given to incorporate the uterosacral pedicles bilaterally, which were also tied in the midline.  All instruments were then removed from the pelvis.  Because of bleeding at the posterior cuff, vaginal packing was placed and a Foley catheter placed. This will be removed in PACU.  The patient tolerated the procedure well.  All instruments, needles, and sponge counts were correct x 2. The patient was taken to the recovery room in stable condition.    Angelina Pih, MD, MPH

## 2017-08-31 NOTE — Discharge Instructions (Signed)
Discharge instructions after  vaginal hysterectomy  For the next three days, take ibuprofen and acetaminophen on a schedule, every 8 hours. You can take them together or you can intersperse them, and take one every four hours. I also gave you gabapentin for nighttime, to help you sleep and also to control pain. Take gabapentin medicines at night for at least the next 3 nights. You also have a narcotic, oxycodone, to take as needed if the above medicines don't help.  Postop constipation is a major cause of pain. Stay well hydrated, walk as you tolerate, and take over the counter senna as well as stool softeners if you need them.   Signs and Symptoms to Report Call our office at [phone] if you have any of the following.   Fever over 100.4 degrees or higher  Severe stomach pain not relieved with pain medications  Bright red bleeding thats heavier than a period that does not slow with rest  To go the bathroom a lot (frequency), you cant hold your urine (urgency), or it hurts when you empty your bladder (urinate)  Chest pain  Shortness of breath  Pain in the calves of your legs  Severe nausea and vomiting not relieved with anti-nausea medications  Signs of infection around your wounds, such as redness, hot to touch, swelling, green/yellow drainage (like pus), bad smelling discharge  Any concerns  What You Can Expect after Surgery  You may see some pink tinged, bloody fluid and bruising around the wound. This is normal.  You may have a sore throat because of the tube in your mouth during general anesthesia. This will go away in 2 to 3 days.  You may have some stomach cramps.  You may notice spotting on your panties.   Activities after Your Discharge Follow these guidelines to help speed your recovery at home:  Do the coughing and deep breathing as you did in the hospital for 2 weeks. Use the small blue breathing device, called the incentive spirometer for 2 weeks.   Dont drive if you are in pain or taking narcotic pain medicine. You may drive when you can safely slam on the brakes, turn the wheel forcefully, and rotate your torso comfortably. This is typically 1-2 weeks. Practice in a parking lot or side street prior to attempting to drive regularly.   Ask others to help with household chores for 4 weeks.  Do not lift anything heavier that 10 pounds for 4-6 weeks. This includes pets, children, and groceries.  Dont do strenuous activities, exercises, or sports like vacuuming, tennis, squash, etc. until your doctor says it is safe to do so. ---Maintain pelvic rest for 8 weeks. This means nothing in the vagina or rectum at all (no douching, tampons, intercourse) for 8 weeks.   Walk as you feel able. Rest often since it may take two or three weeks for your energy level to return to normal.   You may climb stairs  Avoid constipation:   -Eat fruits, vegetables, and whole grains. Eat small meals as your appetite will take time to return to normal.   -Drink 6 to 8 glasses of water each day unless your doctor has told you to limit your fluids.   -Use a laxative or stool softener as needed if constipation becomes a problem. You may take Miralax, metamucil, Citrucil, Colace, Senekot, FiberCon, etc. If this does not relieve the constipation, try two tablespoons of Milk Of Magnesia every 8 hours until your bowels move.  You may shower. Gently wash the wounds with a mild soap and water. Pat dry.  Do not get in a hot tub, swimming pool, etc. for 6 weeks.  Do not use lotions, oils, powders on the wounds.  Do not douche, use tampons, or have sex until your doctor says it is okay.  Take your pain medicine when you need it. The medicine may not work as well if the pain is bad.  Take the medicines you were taking before surgery. Other medications you will need are pain medications (Norco or Percocet) and nausea medications (Zofran).     AMBULATORY SURGERY    DISCHARGE INSTRUCTIONS   1) The drugs that you were given will stay in your system until tomorrow so for the next 24 hours you should not:  A) Drive an automobile B) Make any legal decisions C) Drink any alcoholic beverage   2) You may resume regular meals tomorrow.  Today it is better to start with liquids and gradually work up to solid foods.  You may eat anything you prefer, but it is better to start with liquids, then soup and crackers, and gradually work up to solid foods.   3) Please notify your doctor immediately if you have any unusual bleeding, trouble breathing, redness and pain at the surgery site, drainage, fever, or pain not relieved by medication.    4) Additional Instructions:        Please contact your physician with any problems or Same Day Surgery at [phone], Monday through Friday 6 am to 4 pm, or [place] at [facility] number at [phone].

## 2017-09-01 LAB — SURGICAL PATHOLOGY

## 2017-09-02 ENCOUNTER — Encounter: Payer: Self-pay | Admitting: Internal Medicine

## 2017-09-28 ENCOUNTER — Other Ambulatory Visit: Payer: Self-pay | Admitting: Obstetrics and Gynecology

## 2017-09-28 DIAGNOSIS — Z1231 Encounter for screening mammogram for malignant neoplasm of breast: Secondary | ICD-10-CM

## 2017-11-13 ENCOUNTER — Ambulatory Visit
Admission: RE | Admit: 2017-11-13 | Discharge: 2017-11-13 | Disposition: A | Discharge status: Home / Self Care | Payer: No Typology Code available for payment source | Source: Ambulatory Visit | Attending: Obstetrics and Gynecology | Admitting: Obstetrics and Gynecology

## 2017-11-13 DIAGNOSIS — Z1231 Encounter for screening mammogram for malignant neoplasm of breast: Secondary | ICD-10-CM

## 2019-01-24 ENCOUNTER — Other Ambulatory Visit: Payer: Self-pay | Admitting: Obstetrics and Gynecology

## 2019-01-24 DIAGNOSIS — Z1231 Encounter for screening mammogram for malignant neoplasm of breast: Secondary | ICD-10-CM

## 2019-03-08 ENCOUNTER — Other Ambulatory Visit: Payer: Self-pay

## 2019-03-08 ENCOUNTER — Ambulatory Visit
Admission: RE | Admit: 2019-03-08 | Discharge: 2019-03-08 | Disposition: A | Discharge status: Home / Self Care | Payer: No Typology Code available for payment source | Source: Ambulatory Visit | Attending: Obstetrics and Gynecology | Admitting: Obstetrics and Gynecology

## 2019-03-08 DIAGNOSIS — Z1231 Encounter for screening mammogram for malignant neoplasm of breast: Secondary | ICD-10-CM

## 2019-11-01 ENCOUNTER — Other Ambulatory Visit: Payer: Self-pay

## 2019-11-01 ENCOUNTER — Ambulatory Visit (INDEPENDENT_AMBULATORY_CARE_PROVIDER_SITE_OTHER): Payer: Self-pay | Admitting: Dermatology

## 2019-11-01 ENCOUNTER — Encounter: Payer: Self-pay | Admitting: Dermatology

## 2019-11-01 DIAGNOSIS — L988 Other specified disorders of the skin and subcutaneous tissue: Secondary | ICD-10-CM

## 2019-11-01 NOTE — Progress Notes (Signed)
   Follow-Up Visit   Subjective  Tonya Gregory is a 59 y.o. female who presents for the following: Facial Elastosis (patient is here for Botox. Patient states that injecting the masseters made it difficult to move her mouth.).  The following portions of the chart were reviewed this encounter and updated as appropriate:    Review of Systems: No other skin or systemic complaints.  Objective  Well appearing patient in no apparent distress; mood and affect are within normal limits.  A focused examination was performed including the face. Relevant physical exam findings are noted in the Assessment and Plan.  Objective  Face: Rhytides and volume loss.   Images              Assessment & Plan  Elastosis of skin Face  Frown complex : 20 units DAO's 4 units each for a total of 8 units Brow lift 5 units each for a total of 10 units   Botox Injection - Face Location: See attached image  Informed consent: Discussed risks (infection, pain, bleeding, bruising, swelling, allergic reaction, paralysis of nearby muscles, eyelid droop, double vision, neck weakness, difficulty breathing, headache, undesirable cosmetic result, and need for additional treatment) and benefits of the procedure, as well as the alternatives.  Informed consent was obtained.  Preparation: The area was cleansed with alcohol.  Procedure Details:  Botox was injected into the dermis with a 30-gauge needle. Pressure applied to any bleeding. Ice packs offered for swelling.  Lot Number:  [id number] Expiration:  07/2022  Total Units Injected:  38  Plan: Patient was instructed to remain upright for 4 hours. Patient was instructed to avoid massaging the face and avoid vigorous exercise for the rest of the day. Tylenol may be used for headache.  Allow 2 weeks before returning to clinic for additional dosing as needed. Patient will call for any problems.  Pt may want fillers in mid face and chin/mandible area in  near future.  She will make a Wednesday/Thursday appt for fillers.   Return for 3-4 months for Botox - today's visit $494.

## 2019-11-17 ENCOUNTER — Encounter: Payer: Self-pay | Admitting: Dermatology

## 2019-11-17 ENCOUNTER — Ambulatory Visit (INDEPENDENT_AMBULATORY_CARE_PROVIDER_SITE_OTHER): Payer: Self-pay | Admitting: Dermatology

## 2019-11-17 ENCOUNTER — Other Ambulatory Visit: Payer: Self-pay

## 2019-11-17 DIAGNOSIS — L988 Other specified disorders of the skin and subcutaneous tissue: Secondary | ICD-10-CM

## 2019-11-17 NOTE — Progress Notes (Signed)
   Follow-Up Visit   Subjective  Tonya Gregory is a 59 y.o. female who presents for the following: Facial Elastosis (patient is here today to discuss mid face and lateral chin fillers).  The following portions of the chart were reviewed this encounter and updated as appropriate:    Review of Systems: No other skin or systemic complaints.  Objective  Well appearing patient in no apparent distress; mood and affect are within normal limits.  A focused examination was performed including the face. Relevant physical exam findings are noted in the Assessment and Plan.  Objective  Face: Rhytides and volume loss.   Assessment & Plan  Elastosis of skin Face  Voluma 1cc injected into: - B/L mid face - B/L lat chin / mandible anterior to jowls  Lot#[id number] Exp date:10/29/2020  Will plan Botox and fillers (lat chin, nasolabial, oral commisures) at follow up visit on 01/11/20 at 1:30pm.   Filling material injection - Face Prior to the procedure, the patient's past medical history, allergies and the rare but potential risks and complications were reviewed with the patient and a signed consent was obtained. Pre and post-treatment care was discussed and instructions provided.  Location: cheeks and chin  Filler Type: Juvederm Voluma  Procedure: The area was prepped thoroughly with hibiclens The area was prepped thoroughly with Puracyn. After introducing the needle into the desired treatment area, the syringe plunger was drawn back to ensure there was no flash of blood prior to injecting the filler in order to minimize risk of intravascular injection and vascular occlusion. After injection of the filler, the treated areas were cleansed and iced to reduce swelling. Post-treatment instructions were reviewed with the patient.       Patient tolerated the procedure well. The patient will call with any problems, questions or concerns prior to their next appointment.   Return as scheduled on  01/11/20 at 1:30pm for Botox and fillers.   Luther Redo, CMA, am acting as scribe for Sarina Ser, MD . Documentation: I have reviewed the above documentation for accuracy and completeness, and I agree with the above.  Sarina Ser, MD

## 2019-11-29 ENCOUNTER — Ambulatory Visit
Admission: RE | Admit: 2019-11-29 | Discharge: 2019-11-29 | Disposition: A | Discharge status: Home / Self Care | Payer: Self-pay | Attending: General Practice | Admitting: General Practice

## 2019-12-30 ENCOUNTER — Ambulatory Visit (HOSPITAL_BASED_OUTPATIENT_CLINIC_OR_DEPARTMENT_OTHER): Payer: Self-pay

## 2019-12-30 MED ORDER — IBUPROFEN 800 MG PO TABS
800.00 mg | ORAL_TABLET | Freq: Three times a day (TID) | ORAL | 0 refills | Status: AC | PRN
Start: 2019-12-30 — End: 2020-01-06

## 2019-12-30 MED ORDER — CHLORHEXIDINE GLUCONATE 0.12 % MT SOLN
15.00 mL | Freq: Two times a day (BID) | OROMUCOSAL | 0 refills | Status: AC
Start: 2019-12-30 — End: 2020-01-13

## 2019-12-30 MED ORDER — METHYLPREDNISOLONE 4 MG PO TABS
4.00 mg | ORAL_TABLET | Freq: Two times a day (BID) | ORAL | 0 refills | Status: AC
Start: 2019-12-30 — End: 2020-01-03

## 2019-12-30 NOTE — Dental Note (Signed)
Patient presented for extraction of #14. Reviewed medical history. No  contraindications for Hammad Finkler treatment. Patient reviewed and signed consent  form.    Patient pain level at beginning of appointment: 0    Interpreter: None    *Medical/Pelham Hennick History*  Chief complaint: "I am here for my extraction."  History of present illness: Fractured clinical crown with endodontically  treated root.  Medical history reviewed and upated.    *Extraction*  Reason for extraction: Non-restorable and Root tip  Radiographs taken: 2 or more PAs  Radiographs taken to evaluate for: N/A  Radiographic exam: WNL (no caries, periapical lesions, or other findings)    Pre-op BP:135  Pre-op Pulse:75  Temperature:65    **TIMEOUT**  Date: Friday, Dec 30, 2019  Time: 1:35  Name/DOB confirmed: Yes  Tooth # confirmed: Yes      Local anesthetic used: 4% Articaine (1:100,000 epinephrine) and 2% Lidocaine  (1:100,000 epinephrine)  Whole number of carpules used: 2  Type of injection: Maxillary infiltration and Maxillary palatal    Type of extraction: Surgical  Elevators used: Periosteal, Straight tip and Root tip  Forceps used: N/A  Surgifoam placed: No  Flap raised: Yes  Sutures placed: Yes    Extraction(s) completed. Irrigated with saline. Extraction socket gently  curetted. Sterile gauze and pressure applied, hemostasis achieved.  Placed Osteoss Corticocancellous allograft and Cytoplast RTM collagen  membrane and secured with PTFE sutures. Post op radiograph captured.  Serial no: [id number] Osteoss powder  Ref: [id number] Cytoplast    *Assessment*  Prescription given: Ibuprofen, Peridex, and Medrol  Referral: Not given  Assessment/Plan: Pt tolerated treatment well.    RX-  Ibuprofen 800mg  Q8hrs PRN  Medrol 4mg  BID Q4 days  Peridex rinse 14 day supply    Patient pain level at end of appointment: 0    Patient left ambulatory, satisfied with treatment. Any remaining planned  treatments were explained in detail to the  patient.    Assistant:    Next visit: What: Follow up  Reason:    ----- Signed on Friday, Dec 30, 2019 at 5:23:34 PM  -----  ----- Provider: Stefanie Libel Jan 01, 2020, DMD -- Clinic: [id number]   -----

## 2019-12-30 NOTE — Dental Note (Signed)
Patient Health Assessment    Date:            12/30/2019  Blood Pressure:  131/75  Pulse:           65  Age:             58  Weight:          0 lbs  Height/Length:   0\' 0"   Body Mass Index: 0.0  Provider:         Clinic:          [facility]        ----- Signed on Friday, January 20, 2020 at 4:35:37 PM  -----  ----- Provider: January 22, 2020 [id number], DMD -- Clinic: [facility]   -----

## 2020-01-11 ENCOUNTER — Ambulatory Visit (INDEPENDENT_AMBULATORY_CARE_PROVIDER_SITE_OTHER): Payer: Self-pay | Admitting: Dermatology

## 2020-01-11 ENCOUNTER — Other Ambulatory Visit: Payer: Self-pay

## 2020-01-11 DIAGNOSIS — L988 Other specified disorders of the skin and subcutaneous tissue: Secondary | ICD-10-CM

## 2020-01-11 NOTE — Progress Notes (Signed)
   Follow-Up Visit   Subjective  Tonya Gregory is a 59 y.o. female who presents for the following: Facial Elastosis (Botox and fillers today?).  The following portions of the chart were reviewed this encounter and updated as appropriate:  Tobacco  Allergies  Meds  Problems  Med Hx  Surg Hx  Fam Hx      Review of Systems:  No other skin or systemic complaints except as noted in HPI or Assessment and Plan.  Objective  Well appearing patient in no apparent distress; mood and affect are within normal limits.  A focused examination was performed including face. Relevant physical exam findings are noted in the Assessment and Plan.  Objective  Head - Anterior (Face): Rhytides and volume loss.   Images                     Assessment & Plan  Elastosis of skin Head - Anterior (Face)  Botox Injection - Head - Anterior (Face) Location: See attached image  Informed consent: Discussed risks (infection, pain, bleeding, bruising, swelling, allergic reaction, paralysis of nearby muscles, eyelid droop, double vision, neck weakness, difficulty breathing, headache, undesirable cosmetic result, and need for additional treatment) and benefits of the procedure, as well as the alternatives.  Informed consent was obtained.  Preparation: The area was cleansed with alcohol.  Procedure Details:  Botox was injected into the dermis with a 30-gauge needle. Pressure applied to any bleeding. Ice packs offered for swelling.  Lot Number:  [id number] Expiration: 04/2022  Total Units Injected:  20  Plan: Patient was instructed to remain upright for 4 hours. Patient was instructed to avoid massaging the face and avoid vigorous exercise for the rest of the day. Tylenol may be used for headache.  Allow 2 weeks before returning to clinic for additional dosing as needed. Patient will call for any problems.   Filling material injection - Head - Anterior (Face) Prior to the procedure,  the patient's past medical history, allergies and the rare but potential risks and complications were reviewed with the patient and a signed consent was obtained. Pre and post-treatment care was discussed and instructions provided.  Location: nasolabial folds, oral commissure areas and chin  Filler Type: Restylane Refyne Lot #[id number] Exp 12/01/2020  Procedure: The area was prepped thoroughly with Puracyn. After introducing the needle into the desired treatment area, the syringe plunger was drawn back to ensure there was no flash of blood prior to injecting the filler in order to minimize risk of intravascular injection and vascular occlusion. After injection of the filler, the treated areas were cleansed and iced to reduce swelling. Post-treatment instructions were reviewed with the patient.       Patient tolerated the procedure well. The patient will call with any problems, questions or concerns prior to their next appointment.   Return in about 3 months (around 04/12/2020) for Botox.   I, Ashok Cordia, CMA, am acting as scribe for Sarina Ser, MD .  Documentation: I have reviewed the above documentation for accuracy and completeness, and I agree with the above.  Sarina Ser, MD

## 2020-01-17 ENCOUNTER — Encounter: Payer: Self-pay | Admitting: Dermatology

## 2020-02-28 ENCOUNTER — Other Ambulatory Visit: Payer: Self-pay | Admitting: Obstetrics and Gynecology

## 2020-02-28 DIAGNOSIS — Z1231 Encounter for screening mammogram for malignant neoplasm of breast: Secondary | ICD-10-CM

## 2020-03-20 ENCOUNTER — Other Ambulatory Visit: Payer: Self-pay

## 2020-03-20 ENCOUNTER — Ambulatory Visit
Admission: RE | Admit: 2020-03-20 | Discharge: 2020-03-20 | Disposition: A | Discharge status: Home / Self Care | Payer: No Typology Code available for payment source | Source: Ambulatory Visit | Attending: Obstetrics and Gynecology | Admitting: Obstetrics and Gynecology

## 2020-03-20 DIAGNOSIS — Z1231 Encounter for screening mammogram for malignant neoplasm of breast: Secondary | ICD-10-CM

## 2020-05-09 ENCOUNTER — Other Ambulatory Visit: Payer: Self-pay

## 2020-05-09 ENCOUNTER — Ambulatory Visit (INDEPENDENT_AMBULATORY_CARE_PROVIDER_SITE_OTHER): Payer: Self-pay | Admitting: Dermatology

## 2020-05-09 DIAGNOSIS — B360 Pityriasis versicolor: Secondary | ICD-10-CM

## 2020-05-09 DIAGNOSIS — L988 Other specified disorders of the skin and subcutaneous tissue: Secondary | ICD-10-CM

## 2020-05-09 MED ORDER — KETOCONAZOLE 2 % EX SHAM: MEDICATED_SHAMPOO | CUTANEOUS | 6 refills | Status: DC

## 2020-05-09 NOTE — Progress Notes (Signed)
   Follow-Up Visit   Subjective  Tonya Gregory is a 59 y.o. female who presents for the following: Facial Elastosis (patient is here today for Botox injections). Tinea versicolor - currently flaring. Patient would like refills of Ketoconazole 2% shampoo.   The following portions of the chart were reviewed this encounter and updated as appropriate:  Tobacco  Allergies  Meds  Problems  Med Hx  Surg Hx  Fam Hx     Review of Systems:  No other skin or systemic complaints except as noted in HPI or Assessment and Plan.  Objective  Well appearing patient in no apparent distress; mood and affect are within normal limits.  A focused examination was performed including the face. Relevant physical exam findings are noted in the Assessment and Plan.  Objective  Face: Rhytides and volume loss.   Images    Objective  Trunk and extremities: Hypopigmented macules.  Assessment & Plan  Elastosis of skin Face  Botox 43 units injected as marked:  - Frown complex 25 units - B/L DAO's 4 units each  - B/L brow lift 5 units each   Botox Injection - Face Location: See attached image  Informed consent: Discussed risks (infection, pain, bleeding, bruising, swelling, allergic reaction, paralysis of nearby muscles, eyelid droop, double vision, neck weakness, difficulty breathing, headache, undesirable cosmetic result, and need for additional treatment) and benefits of the procedure, as well as the alternatives.  Informed consent was obtained.  Preparation: The area was cleansed with alcohol.  Procedure Details:  Botox was injected into the dermis with a 30-gauge needle. Pressure applied to any bleeding. Ice packs offered for swelling.  Lot Number:  [id number] Expiration:  08/2022  Total Units Injected:  43  Plan: Patient was instructed to remain upright for 4 hours. Patient was instructed to avoid massaging the face and avoid vigorous exercise for the rest of the day. Tylenol may be  used for headache.  Allow 2 weeks before returning to clinic for additional dosing as needed. Patient will call for any problems.   Tinea versicolor Trunk and extremities  Continue Ketoconazole 2% shampoo 3d/wk x 6 weeks. Then QM there after.   ketoconazole (NIZORAL) 2 % shampoo - Trunk and extremities  Return in about 3 months (around 08/09/2020) for cosmetic - Botox .  Luther Redo, CMA, am acting as scribe for Sarina Ser, MD .  Documentation: I have reviewed the above documentation for accuracy and completeness, and I agree with the above.  Sarina Ser, MD

## 2020-05-10 ENCOUNTER — Encounter: Payer: Self-pay | Admitting: Dermatology

## 2020-07-09 ENCOUNTER — Ambulatory Visit: Payer: No Typology Code available for payment source

## 2020-08-21 ENCOUNTER — Ambulatory Visit (INDEPENDENT_AMBULATORY_CARE_PROVIDER_SITE_OTHER): Payer: Self-pay | Admitting: Dermatology

## 2020-08-21 ENCOUNTER — Other Ambulatory Visit: Payer: Self-pay

## 2020-08-21 DIAGNOSIS — L738 Other specified follicular disorders: Secondary | ICD-10-CM

## 2020-08-21 DIAGNOSIS — L988 Other specified disorders of the skin and subcutaneous tissue: Secondary | ICD-10-CM

## 2020-08-21 NOTE — Progress Notes (Signed)
   Follow-Up Visit   Subjective  Tonya Gregory is a 60 y.o. female who presents for the following: Facial Elastosis (Patient is here today for Botox injections).  The following portions of the chart were reviewed this encounter and updated as appropriate:   Tobacco  Allergies  Meds  Problems  Med Hx  Surg Hx  Fam Hx     Review of Systems:  No other skin or systemic complaints except as noted in HPI or Assessment and Plan.  Objective  Well appearing patient in no apparent distress; mood and affect are within normal limits.  A focused examination was performed including the face. Relevant physical exam findings are noted in the Assessment and Plan.  Objective  Face: Rhytides and volume loss.   Images    Objective  R upper eyelid: 0.2 cm Small yellow papules with a central dell.   Assessment & Plan  Elastosis of skin Face  Botox 43 units injected as marked:  - Frown complex 25 units - Brow lift 5 units each - DAO's 4 units each  Botox Injection - Face Location: See attached image  Informed consent: Discussed risks (infection, pain, bleeding, bruising, swelling, allergic reaction, paralysis of nearby muscles, eyelid droop, double vision, neck weakness, difficulty breathing, headache, undesirable cosmetic result, and need for additional treatment) and benefits of the procedure, as well as the alternatives.  Informed consent was obtained.  Preparation: The area was cleansed with alcohol.  Procedure Details:  Botox was injected into the dermis with a 30-gauge needle. Pressure applied to any bleeding. Ice packs offered for swelling.  Lot Number:  [id number] Expiration:  10/2022  Total Units Injected:  43  Plan: Patient was instructed to remain upright for 4 hours. Patient was instructed to avoid massaging the face and avoid vigorous exercise for the rest of the day. Tylenol may be used for headache.  Allow 2 weeks before returning to clinic for additional dosing as  needed. Patient will call for any problems.   Sebaceous hyperplasia of face R upper eyelid  Benign appearing. Discussed ED if bothersome, recheck at follow up appointment.   Return in about 3 months (around 11/19/2020) for cosmetic - Botox .  Luther Redo, CMA, am acting as scribe for Sarina Ser, MD .  Documentation: I have reviewed the above documentation for accuracy and completeness, and I agree with the above.  Sarina Ser, MD

## 2020-08-23 ENCOUNTER — Encounter: Payer: Self-pay | Admitting: Dermatology

## 2020-11-14 ENCOUNTER — Other Ambulatory Visit: Payer: Self-pay

## 2020-11-14 ENCOUNTER — Ambulatory Visit (INDEPENDENT_AMBULATORY_CARE_PROVIDER_SITE_OTHER): Payer: Self-pay | Admitting: Dermatology

## 2020-11-14 ENCOUNTER — Encounter: Payer: Self-pay | Admitting: Dermatology

## 2020-11-14 DIAGNOSIS — L988 Other specified disorders of the skin and subcutaneous tissue: Secondary | ICD-10-CM

## 2020-11-14 NOTE — Progress Notes (Signed)
   Follow-Up Visit   Subjective  Tonya Gregory is a 60 y.o. female who presents for the following: Facial Elastosis (Patient is here today for Botox injections and would like to discuss oral commissure fillers).  The following portions of the chart were reviewed this encounter and updated as appropriate:   Tobacco  Allergies  Meds  Problems  Med Hx  Surg Hx  Fam Hx     Review of Systems:  No other skin or systemic complaints except as noted in HPI or Assessment and Plan.  Objective  Well appearing patient in no apparent distress; mood and affect are within normal limits.  A focused examination was performed including the face. Relevant physical exam findings are noted in the Assessment and Plan.  Objective  Face: Rhytides and volume loss.   Images                            Assessment & Plan  Elastosis of skin - Botox + Mid face filler (Voluma) today.   May add more filler to mid face next visit + oral commissure filler. Face  Botox 43 units injected as marked:   - Frown complex 25 units - Brow lift 5 units each - DAO's 4 units each  Botox Injection - Face Location: See attached image  Informed consent: Discussed risks (infection, pain, bleeding, bruising, swelling, allergic reaction, paralysis of nearby muscles, eyelid droop, double vision, neck weakness, difficulty breathing, headache, undesirable cosmetic result, and need for additional treatment) and benefits of the procedure, as well as the alternatives.  Informed consent was obtained.  Preparation: The area was cleansed with alcohol.  Procedure Details:  Botox was injected into the dermis with a 30-gauge needle. Pressure applied to any bleeding. Ice packs offered for swelling.  Lot Number:  [id number] Expiration:  12/2022  Total Units Injected:  43  Plan: Patient was instructed to remain upright for 4 hours. Patient was instructed to avoid massaging the face and avoid vigorous  exercise for the rest of the day. Tylenol may be used for headache.  Allow 2 weeks before returning to clinic for additional dosing as needed. Patient will call for any problems.   Filling material injection - Face Prior to the procedure, the patient's past medical history, allergies and the rare but potential risks and complications were reviewed with the patient and a signed consent was obtained. Pre and post-treatment care was discussed and instructions provided.  Location: cheeks  Filler TypeMilon Dikes  Lot # [id number]  Expiration Date: 08/28/2021   Procedure: The area was prepped thoroughly with Puracyn. After introducing the needle into the desired treatment area, the syringe plunger was drawn back to ensure there was no flash of blood prior to injecting the filler in order to minimize risk of intravascular injection and vascular occlusion. After injection of the filler, the treated areas were cleansed and iced to reduce swelling. Post-treatment instructions were reviewed with the patient.       Patient tolerated the procedure well. The patient will call with any problems, questions or concerns prior to their next appointment.   Return in about 4 months (around 03/16/2021) for Botox .  Luther Redo, CMA, am acting as scribe for Sarina Ser, MD .  Documentation: I have reviewed the above documentation for accuracy and completeness, and I agree with the above.  Sarina Ser, MD

## 2020-11-14 NOTE — Patient Instructions (Signed)

## 2020-12-26 ENCOUNTER — Other Ambulatory Visit: Payer: Self-pay

## 2020-12-26 ENCOUNTER — Ambulatory Visit (INDEPENDENT_AMBULATORY_CARE_PROVIDER_SITE_OTHER): Payer: Self-pay | Admitting: Dermatology

## 2020-12-26 DIAGNOSIS — L988 Other specified disorders of the skin and subcutaneous tissue: Secondary | ICD-10-CM

## 2020-12-26 NOTE — Progress Notes (Signed)
   Follow-Up Visit   Subjective  Tonya Gregory is a 60 y.o. female who presents for the following: Facial Elastosis (Filler to nasolabial and oral commissure areas today).  The following portions of the chart were reviewed this encounter and updated as appropriate:   Tobacco  Allergies  Meds  Problems  Med Hx  Surg Hx  Fam Hx     Review of Systems:  No other skin or systemic complaints except as noted in HPI or Assessment and Plan.  Objective  Well appearing patient in no apparent distress; mood and affect are within normal limits.  A focused examination was performed including face. Relevant physical exam findings are noted in the Assessment and Plan.  Objective  Head - Anterior (Face): Rhytides and volume loss.   Images             Assessment & Plan  Elastosis of skin Head - Anterior (Face)  Filling material injection - Head - Anterior (Face) Prior to the procedure, the patient's past medical history, allergies and the rare but potential risks and complications were reviewed with the patient and a signed consent was obtained. Pre and post-treatment care was discussed and instructions provided.  Location of injections: lips (vermilion), nasolabial folds, oral commissures; marionette lines, and anterior chin crease.  Filler Type: Restylane Refyne Lot [id number] Exp 10/01/2021  Procedure: Lidocaine-tetracaine ointment was applied to treatment areas to achieve good local anesthesia. The area was prepped thoroughly with Puracyn. After introducing the needle into the desired treatment area, the syringe plunger was drawn back to ensure there was no flash of blood prior to injecting the filler in order to minimize risk of intravascular injection and vascular occlusion.    Patient tolerated the procedure well. The patient will call with any problems, questions or concerns prior to their next appointment.   Return for Follow up as scheduled Botox.  I, Ashok Cordia,  CMA, am acting as scribe for Sarina Ser, MD .  Documentation: I have reviewed the above documentation for accuracy and completeness, and I agree with the above.  Sarina Ser, MD

## 2020-12-26 NOTE — Patient Instructions (Signed)

## 2020-12-31 ENCOUNTER — Encounter: Payer: Self-pay | Admitting: Dermatology

## 2021-01-10 ENCOUNTER — Other Ambulatory Visit: Payer: Self-pay | Admitting: Internal Medicine

## 2021-01-10 DIAGNOSIS — Z1231 Encounter for screening mammogram for malignant neoplasm of breast: Secondary | ICD-10-CM

## 2021-01-16 ENCOUNTER — Ambulatory Visit: Payer: No Typology Code available for payment source | Admitting: Dermatology

## 2021-02-26 ENCOUNTER — Other Ambulatory Visit: Payer: Self-pay

## 2021-02-26 ENCOUNTER — Ambulatory Visit (INDEPENDENT_AMBULATORY_CARE_PROVIDER_SITE_OTHER): Payer: Self-pay | Admitting: Dermatology

## 2021-02-26 DIAGNOSIS — L719 Rosacea, unspecified: Secondary | ICD-10-CM

## 2021-02-26 DIAGNOSIS — L988 Other specified disorders of the skin and subcutaneous tissue: Secondary | ICD-10-CM

## 2021-02-26 DIAGNOSIS — L818 Other specified disorders of pigmentation: Secondary | ICD-10-CM

## 2021-02-26 DIAGNOSIS — B360 Pityriasis versicolor: Secondary | ICD-10-CM

## 2021-02-26 NOTE — Progress Notes (Signed)
   Follow-Up Visit   Subjective  Tonya Gregory is a 60 y.o. female who presents for the following: No chief complaint on file..   The following portions of the chart were reviewed this encounter and updated as appropriate:   Tobacco  Allergies  Meds  Problems  Med Hx  Surg Hx  Fam Hx     Review of Systems:  No other skin or systemic complaints except as noted in HPI or Assessment and Plan.  Objective  Well appearing patient in no apparent distress; mood and affect are within normal limits.  A focused examination was performed including face. Relevant physical exam findings are noted in the Assessment and Plan.  face Rhytides and volume loss.      bil arms Hypopigmented macules arms  face Erythema and telangiectasias face  trunk, extremities Hypopigmented scaly patches arms   Assessment & Plan  Elastosis of skin face  Botox 43 units injected today as marked:  - Frown complex 25 units - Brow lift 5 units each - DAO's 4 units each  Intralesional injection - face Location: Frown complex, brow lift bil, DAO's  Informed consent: Discussed risks (infection, pain, bleeding, bruising, swelling, allergic reaction, paralysis of nearby muscles, eyelid droop, double vision, neck weakness, difficulty breathing, headache, undesirable cosmetic result, and need for additional treatment) and benefits of the procedure, as well as the alternatives.  Informed consent was obtained.  Preparation: The area was cleansed with alcohol.  Procedure Details:  Botox was injected into the dermis with a 30-gauge needle. Pressure applied to any bleeding. Ice packs offered for swelling.  Lot Number:  [id number] Expiration:  10/2022  Total Units Injected:  43  Plan: Patient was instructed to remain upright for 4 hours. Patient was instructed to avoid massaging the face and avoid vigorous exercise for the rest of the day. Tylenol may be used for headache.  Allow 2 weeks before returning to  clinic for additional dosing as needed. Patient will call for any problems.   Idiopathic guttate hypomelanosis bil arms  Benign, observe  Rosacea face  Rosacea is a chronic progressive skin condition usually affecting the face of adults, causing redness and/or acne bumps. It is treatable but not curable. It sometimes affects the eyes (ocular rosacea) as well. It may respond to topical and/or systemic medication and can flare with stress, sun exposure, alcohol, exercise and some foods.  Daily application of broad spectrum spf 30+ sunscreen to face is recommended to reduce flares.  Discussed BBL, $350 per treatment session, several treatments for best results  Tinea versicolor trunk, extremities  Restart Ketoconazole 2% shampoo 3d/wk x 6 weeks. Then QM there after.    Related Medications ketoconazole (NIZORAL) 2 % shampoo Shampoo into skin let sit 5-10 minutes then wash off. Use 3d/wk x 6 wks. Then use QM.  Return for 3-59mBotox, BBL face.  I, SOthelia Pulling RMA, am acting as scribe for DSarina Ser MD . Documentation: I have reviewed the above documentation for accuracy and completeness, and I agree with the above.  DSarina Ser MD

## 2021-02-26 NOTE — Patient Instructions (Signed)

## 2021-02-27 ENCOUNTER — Encounter: Payer: Self-pay | Admitting: Dermatology

## 2021-03-20 ENCOUNTER — Ambulatory Visit
Admission: RE | Admit: 2021-03-20 | Discharge: 2021-03-20 | Disposition: A | Discharge status: Home / Self Care | Payer: PRIVATE HEALTH INSURANCE | Source: Ambulatory Visit | Attending: Internal Medicine | Admitting: Internal Medicine

## 2021-03-20 ENCOUNTER — Other Ambulatory Visit: Payer: Self-pay

## 2021-03-20 DIAGNOSIS — Z1231 Encounter for screening mammogram for malignant neoplasm of breast: Secondary | ICD-10-CM

## 2021-05-02 ENCOUNTER — Other Ambulatory Visit: Payer: Self-pay | Admitting: Internal Medicine

## 2021-05-02 ENCOUNTER — Other Ambulatory Visit: Payer: Self-pay

## 2021-05-02 ENCOUNTER — Ambulatory Visit
Admission: RE | Admit: 2021-05-02 | Discharge: 2021-05-02 | Disposition: A | Discharge status: Home / Self Care | Payer: PRIVATE HEALTH INSURANCE | Source: Ambulatory Visit | Attending: Internal Medicine | Admitting: Internal Medicine

## 2021-05-02 DIAGNOSIS — I10 Essential (primary) hypertension: Secondary | ICD-10-CM

## 2021-05-07 ENCOUNTER — Other Ambulatory Visit: Payer: Self-pay

## 2021-05-07 ENCOUNTER — Encounter: Payer: Self-pay | Admitting: Dermatology

## 2021-05-07 ENCOUNTER — Ambulatory Visit (INDEPENDENT_AMBULATORY_CARE_PROVIDER_SITE_OTHER): Payer: Self-pay | Admitting: Dermatology

## 2021-05-07 DIAGNOSIS — L988 Other specified disorders of the skin and subcutaneous tissue: Secondary | ICD-10-CM

## 2021-05-07 NOTE — Patient Instructions (Signed)

## 2021-05-07 NOTE — Progress Notes (Signed)
   Follow-Up Visit   Subjective  Tonya Gregory is a 60 y.o. female who presents for the following: Facial Elastosis (Face, pt presents for botox today).  The following portions of the chart were reviewed this encounter and updated as appropriate:   Tobacco  Allergies  Meds  Problems  Med Hx  Surg Hx  Fam Hx     Review of Systems:  No other skin or systemic complaints except as noted in HPI or Assessment and Plan.  Objective  Well appearing patient in no apparent distress; mood and affect are within normal limits.  A focused examination was performed including face. Relevant physical exam findings are noted in the Assessment and Plan.  face Rhytides and volume loss.       Assessment & Plan  Elastosis of skin face  Botox 43 units injected today as marked:  - Frown complex 25 units - Brow lift 5 units each - DAO's 4 units each  Filling material injection - face Location: Frown complex, bil brow lift, DAO's  Informed consent: Discussed risks (infection, pain, bleeding, bruising, swelling, allergic reaction, paralysis of nearby muscles, eyelid droop, double vision, neck weakness, difficulty breathing, headache, undesirable cosmetic result, and need for additional treatment) and benefits of the procedure, as well as the alternatives.  Informed consent was obtained.  Preparation: The area was cleansed with alcohol.  Procedure Details:  Botox was injected into the dermis with a 30-gauge needle. Pressure applied to any bleeding. Ice packs offered for swelling.  Lot Number:  [id number] Expiration:  11/2022  Total Units Injected:  43  Plan: Patient was instructed to remain upright for 4 hours. Patient was instructed to avoid massaging the face and avoid vigorous exercise for the rest of the day. Tylenol may be used for headache.  Allow 2 weeks before returning to clinic for additional dosing as needed. Patient will call for any problems.   Return for 3-31m Botox.  I,  Othelia Pulling, RMA, am acting as scribe for Sarina Ser, MD . Documentation: I have reviewed the above documentation for accuracy and completeness, and I agree with the above.  Sarina Ser, MD

## 2021-05-09 ENCOUNTER — Telehealth: Payer: Self-pay | Admitting: Internal Medicine

## 2021-05-09 DIAGNOSIS — I10 Essential (primary) hypertension: Secondary | ICD-10-CM

## 2021-05-09 DIAGNOSIS — E785 Hyperlipidemia, unspecified: Secondary | ICD-10-CM

## 2021-05-09 DIAGNOSIS — R7301 Impaired fasting glucose: Secondary | ICD-10-CM

## 2021-05-09 DIAGNOSIS — R5383 Other fatigue: Secondary | ICD-10-CM

## 2021-05-09 DIAGNOSIS — Z79899 Other long term (current) drug therapy: Secondary | ICD-10-CM

## 2021-05-09 NOTE — Telephone Encounter (Signed)
Patient is calling in to see if she can have labs done before her 11/8 appointment.Please advise.

## 2021-05-09 NOTE — Telephone Encounter (Signed)
Pt would like to have labs done prior to physical. I have ordered Microalbumin, lipid panel, cmp, a1c, cbc, and tsh. Is there anything else that needs to be ordered?

## 2021-05-14 ENCOUNTER — Ambulatory Visit: Payer: Self-pay | Admitting: Dermatology

## 2021-05-29 ENCOUNTER — Other Ambulatory Visit: Payer: Self-pay

## 2021-05-29 ENCOUNTER — Telehealth: Payer: Self-pay | Admitting: Internal Medicine

## 2021-05-29 ENCOUNTER — Encounter: Payer: Self-pay | Admitting: Dermatology

## 2021-05-29 ENCOUNTER — Ambulatory Visit (INDEPENDENT_AMBULATORY_CARE_PROVIDER_SITE_OTHER): Payer: Self-pay | Admitting: Dermatology

## 2021-05-29 DIAGNOSIS — L719 Rosacea, unspecified: Secondary | ICD-10-CM

## 2021-05-29 DIAGNOSIS — I781 Nevus, non-neoplastic: Secondary | ICD-10-CM

## 2021-05-29 DIAGNOSIS — M67441 Ganglion, right hand: Secondary | ICD-10-CM

## 2021-05-29 DIAGNOSIS — M6740 Ganglion, unspecified site: Secondary | ICD-10-CM

## 2021-05-29 NOTE — Patient Instructions (Signed)

## 2021-05-29 NOTE — Progress Notes (Signed)
   Follow-Up Visit   Subjective  Tonya Gregory is a 60 y.o. female who presents for the following: Procedure (BBL treatment today).  The following portions of the chart were reviewed this encounter and updated as appropriate:   Tobacco  Allergies  Meds  Problems  Med Hx  Surg Hx  Fam Hx     Review of Systems:  No other skin or systemic complaints except as noted in HPI or Assessment and Plan.  Objective  Well appearing patient in no apparent distress; mood and affect are within normal limits.  A focused examination was performed including face, right hand. Relevant physical exam findings are noted in the Assessment and Plan.  Face Erythema and dilated blood vessels                  Assessment & Plan  Rosacea with Telangiectasias Face Treated on the Telangiectasia settings as above.  [place] BBL - 05/29/21 1500      Patient Details   Skin Type: II    Anesthestic Cream Applied: No    Photo Takes: Yes    Consent Signed: Yes      Treatment Details   Date: 05/29/21    Treatment #: 1    Filter: 1st Pass      1st Pass   Location: F    BBL j/cm2: 25    PW Msec Sec: 27    Cooling Temp: 20    Pulses: 187     Photorejuvenation - Face Prior to the procedure, the patient's past medical history, medications, allergies, and the rare but potential risks and complications were reviewed with the patient and a signed consent was obtained.  Pre and post treatment care was discussed and instructions provided.  Patient tolerated the procedure well.   Nancy Fetter avoidance was stressed. The patient will call with any problems, questions or concerns prior to their next appointment.  Ganglion cyst Right Hand - Posterior  Recommend evaluation by orthopaedic surgeon if it becomes bothersome  Return in about 5 months (around 10/27/2021) for BBL.  I, Ashok Cordia, CMA, am acting as scribe for Sarina Ser, MD .  Documentation: I have reviewed the above documentation for  accuracy and completeness, and I agree with the above.  Sarina Ser, MD

## 2021-05-29 NOTE — Telephone Encounter (Signed)
Labs in system

## 2021-05-30 ENCOUNTER — Telehealth: Payer: Self-pay

## 2021-05-30 NOTE — Telephone Encounter (Signed)
Left message for patient regarding procedure yesterday/hd

## 2021-06-03 ENCOUNTER — Other Ambulatory Visit: Payer: Self-pay | Admitting: Internal Medicine

## 2021-06-03 DIAGNOSIS — K7689 Other specified diseases of liver: Secondary | ICD-10-CM

## 2021-06-06 ENCOUNTER — Ambulatory Visit
Admission: RE | Admit: 2021-06-06 | Discharge: 2021-06-06 | Disposition: A | Discharge status: Home / Self Care | Payer: Self-pay | Source: Ambulatory Visit | Attending: Internal Medicine | Admitting: Internal Medicine

## 2021-06-06 ENCOUNTER — Other Ambulatory Visit: Payer: Self-pay

## 2021-06-06 DIAGNOSIS — K7689 Other specified diseases of liver: Secondary | ICD-10-CM | POA: Insufficient documentation

## 2021-06-07 ENCOUNTER — Other Ambulatory Visit: Payer: Self-pay

## 2021-06-07 ENCOUNTER — Other Ambulatory Visit (INDEPENDENT_AMBULATORY_CARE_PROVIDER_SITE_OTHER): Payer: Self-pay

## 2021-06-07 DIAGNOSIS — I10 Essential (primary) hypertension: Secondary | ICD-10-CM

## 2021-06-07 DIAGNOSIS — R7301 Impaired fasting glucose: Secondary | ICD-10-CM

## 2021-06-07 DIAGNOSIS — E785 Hyperlipidemia, unspecified: Secondary | ICD-10-CM

## 2021-06-07 DIAGNOSIS — R5383 Other fatigue: Secondary | ICD-10-CM

## 2021-06-07 DIAGNOSIS — Z79899 Other long term (current) drug therapy: Secondary | ICD-10-CM

## 2021-06-07 LAB — MICROALBUMIN / CREATININE URINE RATIO
Creatinine,U: 105.3 mg/dL
Microalb Creat Ratio: 0.7 mg/g (ref 0.0–30.0)
Microalb, Ur: <0.7 mg/dL (ref 0.0–1.9)

## 2021-06-07 LAB — CBC WITH DIFFERENTIAL/PLATELET
Basophils Absolute: 0 10*3/uL (ref 0.0–0.1)
Basophils Relative: 0.5 % (ref 0.0–3.0)
Eosinophils Absolute: 0.1 10*3/uL (ref 0.0–0.7)
Eosinophils Relative: 1.5 % (ref 0.0–5.0)
HCT: 42.1 % (ref 36.0–46.0)
Hemoglobin: 14.3 g/dL (ref 12.0–15.0)
Lymphocytes Relative: 36.1 % (ref 12.0–46.0)
Lymphs Abs: 2 10*3/uL (ref 0.7–4.0)
MCHC: 34 g/dL (ref 30.0–36.0)
MCV: 93.6 fl (ref 78.0–100.0)
Monocytes Absolute: 0.3 10*3/uL (ref 0.1–1.0)
Monocytes Relative: 5.9 % (ref 3.0–12.0)
Neutro Abs: 3.1 10*3/uL (ref 1.4–7.7)
Neutrophils Relative %: 56 % (ref 43.0–77.0)
Platelets: 181 10*3/uL (ref 150.0–400.0)
RBC: 4.5 Mil/uL (ref 3.87–5.11)
RDW: 12.2 % (ref 11.5–15.5)
WBC: 5.5 10*3/uL (ref 4.0–10.5)

## 2021-06-07 LAB — COMPREHENSIVE METABOLIC PANEL
ALT: 29 U/L (ref 0–35)
AST: 19 U/L (ref 0–37)
Albumin: 4.4 g/dL (ref 3.5–5.2)
Alkaline Phosphatase: 53 U/L (ref 39–117)
BUN: 17 mg/dL (ref 6–23)
CO2: 28 mEq/L (ref 19–32)
Calcium: 9.3 mg/dL (ref 8.4–10.5)
Chloride: 105 mEq/L (ref 96–112)
Creatinine, Ser: 0.94 mg/dL (ref 0.40–1.20)
GFR: 65.63 mL/min (ref >60.00)
Glucose, Bld: 118 mg/dL — ABNORMAL HIGH (ref 70–99)
Potassium: 4.8 mEq/L (ref 3.5–5.1)
Sodium: 140 mEq/L (ref 135–145)
Total Bilirubin: 0.8 mg/dL (ref 0.2–1.2)
Total Protein: 7 g/dL (ref 6.0–8.3)

## 2021-06-07 LAB — LIPID PANEL
Cholesterol: 207 mg/dL — ABNORMAL HIGH (ref 0–200)
HDL: 43.7 mg/dL (ref >39.00)
LDL Cholesterol: 135 mg/dL — ABNORMAL HIGH (ref 0–99)
NonHDL: 163.79
Total CHOL/HDL Ratio: 5
Triglycerides: 144 mg/dL (ref 0.0–149.0)
VLDL: 28.8 mg/dL (ref 0.0–40.0)

## 2021-06-07 LAB — HEMOGLOBIN A1C: Hgb A1c MFr Bld: 5.8 % (ref 4.6–6.5)

## 2021-06-07 LAB — TSH: TSH: 1.18 u[IU]/mL (ref 0.35–5.50)

## 2021-06-11 ENCOUNTER — Ambulatory Visit (INDEPENDENT_AMBULATORY_CARE_PROVIDER_SITE_OTHER): Payer: Self-pay | Admitting: Internal Medicine

## 2021-06-11 ENCOUNTER — Other Ambulatory Visit: Payer: Self-pay

## 2021-06-11 ENCOUNTER — Encounter: Payer: Self-pay | Admitting: Internal Medicine

## 2021-06-11 VITALS — BP 132/80 | HR 76 | Temp 97.6°F | Ht 68.25 in | Wt 185.6 lb

## 2021-06-11 DIAGNOSIS — Z0000 Encounter for general adult medical examination without abnormal findings: Secondary | ICD-10-CM

## 2021-06-11 DIAGNOSIS — K7689 Other specified diseases of liver: Secondary | ICD-10-CM | POA: Insufficient documentation

## 2021-06-11 DIAGNOSIS — R140 Abdominal distension (gaseous): Secondary | ICD-10-CM

## 2021-06-11 DIAGNOSIS — K760 Fatty (change of) liver, not elsewhere classified: Secondary | ICD-10-CM

## 2021-06-11 DIAGNOSIS — Z23 Encounter for immunization: Secondary | ICD-10-CM

## 2021-06-11 NOTE — Assessment & Plan Note (Signed)
Addressed alcohol use and advised to reduce alcohol use . Referral to Dr Gerald Dexter for management of fatty liver and hepatic cyst

## 2021-06-11 NOTE — Assessment & Plan Note (Signed)
Large, asymptomatic. Patient is very concerned that because of its size it could rupture  Referral to Dr Gerald Dexter for management

## 2021-06-11 NOTE — Assessment & Plan Note (Signed)

## 2021-06-11 NOTE — Patient Instructions (Signed)
1) Practice Kegel exercises for the bladder  2) transvaginal ultrasound ordered to rule out ovarian CA  3) referral to Dr Pleas Patricia at the [facility]  3) Fatty liver:  reduce alcohol to one glass of wine nightly    4) Your  Fasting  glucose is elevated but not diagnostic of diabetes; however your a1c is at the upper limit of normal and this means that you are at risk for developing type 2 Diabetes.  I recommend continued efforts at losing weight, (your first goal is 10% of your current body weight  over the next 6 months using a  low glycemic index diet and  Regular participation in some form of  aerobic exercise .  I would like to see you again after we repeat your fasting labs in 6 months

## 2021-06-11 NOTE — Progress Notes (Signed)
Patient ID: Tonya Gregory, female    DOB: [birth year]  Age: 60 y.o. MRN: [id number]  The patient is here for annual wellness examination and management of other chronic and acute problems. However she has been lost to follow up for 6 years and has multiple questions and issues.  This visit occurred during the SARS-CoV-2 public health emergency.  Safety protocols were in place, including screening questions prior to the visit, additional usage of staff PPE, and extensive cleaning of exam room while observing appropriate contact time as indicated for disinfecting solutions.   This visit occurred during the SARS-CoV-2 public health emergency.  Safety protocols were in place, including screening questions prior to the visit, additional usage of staff PPE, and extensive cleaning of exam room while observing appropriate contact time as indicated for disinfecting solutions.     The risk factors are reflected in the social history.  The roster of all physicians providing medical care to patient - is listed in the Snapshot section of the chart.  Activities of daily living:  The patient is 100% independent in all ADLs: dressing, toileting, feeding as well as independent mobility  Home safety : The patient has smoke detectors in the home. They wear seatbelts.  There are no firearms at home. There is no violence in the home.   There is no risks for hepatitis, STDs or HIV. There is no   history of blood transfusion. They have no travel history to infectious disease endemic areas of the world.  The patient has seen their dentist in the last six month. They have seen their eye doctor in the last year. They deny slight hearing difficulty with regard to whispered voices and some television programs.  They have deferred audiologic testing in the last year.  They do not  have excessive sun exposure. Discussed the need for sun protection: hats, long sleeves and use of sunscreen if there is significant sun exposure.    Diet: the importance of a healthy diet is discussed. They do have a healthy diet but also drinks  to 4 glasses of wine nightly   The benefits of regular aerobic exercise were discussed. She walks 4 times per week ,  20 minutes.   Depression screen: there are no signs or vegative symptoms of depression- irritability, change in appetite, anhedonia, sadness/tearfullness.  Cognitive assessment: the patient manages all their financial and personal affairs and is actively engaged. They could relate day,date,year and events; recalled 2/3 objects at 3 minutes; performed clock-face test normally.  The following portions of the patient's history were reviewed and updated as appropriate: allergies, current medications, past family history, past medical history,  past surgical history, past social history  and problem list.  Visual acuity was not assessed per patient preference since she has regular follow up with her ophthalmologist. Hearing and body mass index were assessed and reviewed.   During the course of the visit the patient was educated and counseled about appropriate screening and preventive services including : fall prevention , diabetes screening, nutrition counseling, colorectal cancer screening, and recommended immunizations.    CC: The primary encounter diagnosis was Need for immunization against influenza. Diagnoses of Abdominal bloating, Simple hepatic cyst, Hepatic steatosis, Bloating symptom, and Encounter for preventive health examination were also pertinent to this visit.  1) incidental cyst noted on liverr during Cardiac CT.  abd  Ultrasound ordered by Dr. Nehemiah Massed  cyst measures  9 cm.  Simple cyst.  Wondering if it should be drainied  2)  Fatty liver suggested by ultrasound.  Reviewed lifestyle and identified excessive alcohol use  3) S/P  TOTAL HYSTERECTOMY  4 yrs ago.  Some suprapubic bloating/fullness, and stress urge incontinence    History Olive has a past medical  history of Asthma, Family history of adverse reaction to anesthesia, Hypertension, Hypothyroidism, S/P TAH (total abdominal hysterectomy) (08/2017), and Tuberculosis.   She has a past surgical history that includes Colonoscopy; Dilatation & curettage/hysteroscopy with myosure (N/A, 11/26/2015); Vaginal hysterectomy (N/A, 08/31/2017); and Bilateral salpingectomy (Bilateral, 08/31/2017).   Her family history includes Breast cancer (age of onset: 71) in her maternal grandmother; Stroke in her maternal grandmother; Stroke (age of onset: 15) in her mother.She reports that she quit smoking about 35 years ago. Her smoking use included cigarettes. She has never used smokeless tobacco. She reports current alcohol use of about 14.0 standard drinks per week. She reports that she does not use drugs.  Outpatient Medications Prior to Visit  Medication Sig Dispense Refill   acetaminophen (TYLENOL) 500 MG tablet Take 500 mg by mouth every 6 (six) hours as needed (for pain/headaches.).     albuterol (PROVENTIL HFA;VENTOLIN HFA) 108 (90 Base) MCG/ACT inhaler Inhale 1-2 puffs into the lungs every 6 (six) hours as needed for wheezing or shortness of breath.     ibuprofen (ADVIL,MOTRIN) 200 MG tablet Take 400 mg by mouth every 8 (eight) hours as needed (for pain/headaches.).     lisinopril (PRINIVIL,ZESTRIL) 10 MG tablet Take 10 mg by mouth every morning.      Nutritional Supplements (JUICE PLUS FIBRE PO) Take 6 capsules by mouth daily. Take 2 tablet Juice Plus Fruit, 2 tablets of Vegetable, & 2 tablets of Berry Blend in the morning     Omega-3 Fatty Acids (FISH OIL PO) Take 1 capsule by mouth daily.     PRESCRIPTION MEDICATION PT GETTING TESTOSTERONE PELLETS INJECTED AT [facility] IN [facility] WITH DR Ellison Carwin INJECTED DEC 2018     progesterone (PROMETRIUM) 200 MG capsule Take 200 mg by mouth at bedtime.     thyroid (ARMOUR) 15 MG tablet Take 15 mg by mouth daily.     aspirin EC 81 MG tablet Take 81 mg by  mouth 2 (two) times a week. Once to twice a week in the morning     cholecalciferol (VITAMIN D) 1000 units tablet Take 1,000 Units by mouth daily.     docusate sodium (COLACE) 100 MG capsule Take 1 capsule (100 mg total) by mouth 2 (two) times daily. To keep stools soft 30 capsule 0   gabapentin (NEURONTIN) 800 MG tablet Take 1 tablet (800 mg total) by mouth at bedtime for 3 days. May take nightly for up to 4 weeks 14 tablet 2   Glucosamine HCl (GLUCOSAMINE PO) Take 1 tablet by mouth daily. (Patient not taking: Reported on 06/11/2021)     Homeopathic Products (ZICAM COLD REMEDY PO) Take 1 tablet by mouth daily as needed (for cold symptoms.). (Patient not taking: Reported on 06/11/2021)     ibuprofen (ADVIL,MOTRIN) 800 MG tablet Take 1 tablet (800 mg total) by mouth every 8 (eight) hours as needed for moderate pain. (Patient not taking: Reported on 06/11/2021) 30 tablet 1   ketoconazole (NIZORAL) 2 % shampoo Shampoo into skin let sit 5-10 minutes then wash off. Use 3d/wk x 6 wks. Then use QM. 120 mL 6   oxycodone (OXY-IR) 5 MG capsule Take 1 capsule (5 mg total) by mouth every 6 (six) hours as needed for pain. 15 capsule  0   No facility-administered medications prior to visit.    Review of Systems   Patient denies headache, fevers, malaise, unintentional weight loss, skin rash, eye pain, sinus congestion and sinus pain, sore throat, dysphagia,  hemoptysis , cough, dyspnea, wheezing, chest pain, palpitations, orthopnea, edema, abdominal pain, nausea, melena, diarrhea, constipation, flank pain, dysuria, hematuria, urinary  Frequency, nocturia, numbness, tingling, seizures,  Focal weakness, Loss of consciousness,  Tremor, insomnia, depression, anxiety, and suicidal ideation.     Objective:  BP 132/80 (BP Location: Left Arm, Patient Position: Sitting, Cuff Size: Normal)   Pulse 76   Temp 97.6 F (36.4 C)   Ht 5' 8.25" (1.734 m)   Wt 185 lb 9.6 oz (84.2 kg)   LMP 11/15/2014   SpO2 98%   BMI 28.01  kg/m   Physical Exam  General appearance: alert, cooperative and appears stated age Ears: normal TM's and external ear canals both ears Throat: lips, mucosa, and tongue normal; teeth and gums normal Neck: no adenopathy, no carotid bruit, supple, symmetrical, trachea midline and thyroid not enlarged, symmetric, no tenderness/mass/nodules Back: symmetric, no curvature. ROM normal. No CVA tenderness. Lungs: clear to auscultation bilaterally Heart: regular rate and rhythm, S1, S2 normal, no murmur, click, rub or gallop Abdomen: soft, non-tender; bowel sounds normal; no masses,  no organomegaly Pulses: 2+ and symmetric Skin: Skin color, texture, turgor normal. No rashes or lesions Lymph nodes: Cervical, supraclavicular, and axillary nodes normal.   Assessment & Plan:   Problem List Items Addressed This Visit     Encounter for preventive health examination    age appropriate education and counseling updated, referrals for preventative services and immunizations addressed, dietary and smoking counseling addressed, most recent labs reviewed.  I have personally reviewed and have noted:   1) the patient's medical and social history 2) The pt's use of alcohol, tobacco, and illicit drugs 3) The patient's current medications and supplements 4) Functional ability including ADL's, fall risk, home safety risk, hearing and visual impairment 5) Diet and physical activities 6) Evidence for depression or mood disorder 7) The patient's height, weight, and BMI have been recorded in the chart   I have made referrals, and provided counseling and education based on review of the above      Hepatic steatosis    Addressed alcohol use and advised to reduce alcohol use . Referral to Dr Gerald Dexter for management of fatty liver and hepatic cyst      Relevant Orders   Ambulatory referral to Gastroenterology   Simple hepatic cyst    Large, asymptomatic. Patient is very concerned that because of its size it could  rupture  Referral to Dr Gerald Dexter for management       Bloating symptom    Suprapubic,  With history of TAH.  Pelvic U/s and CA 125 ordered to rule out ovarian mass       Other Visit Diagnoses     Need for immunization against influenza    -  Primary   Relevant Orders   Flu Vaccine QUAD 55mo+IM (Fluarix, Fluzone & Alfiuria Quad PF) (Completed)   Abdominal bloating       Relevant Orders   US Transvaginal Non-OB   CA 125       I have discontinued Riley Kill. Pettey's Glucosamine HCl (GLUCOSAMINE PO), cholecalciferol, aspirin EC, Homeopathic Products (ZICAM COLD REMEDY PO), docusate sodium, gabapentin, oxycodone, and ketoconazole. I am also having her maintain her albuterol, Nutritional Supplements (JUICE PLUS FIBRE PO), thyroid, lisinopril, progesterone, Omega-3  Fatty Acids (FISH OIL PO), acetaminophen, ibuprofen, and PRESCRIPTION MEDICATION.  No orders of the defined types were placed in this encounter.   Medications Discontinued During This Encounter  Medication Reason   aspirin EC 81 MG tablet    cholecalciferol (VITAMIN D) 1000 units tablet    docusate sodium (COLACE) 100 MG capsule Error   gabapentin (NEURONTIN) 800 MG tablet Error   ketoconazole (NIZORAL) 2 % shampoo Error   oxycodone (OXY-IR) 5 MG capsule Error   Glucosamine HCl (GLUCOSAMINE PO)    Homeopathic Products (ZICAM COLD REMEDY PO)    ibuprofen (ADVIL,MOTRIN) 800 MG tablet     Follow-up: Return in about 6 months (around 12/09/2021).   Crecencio Mc, MD

## 2021-06-11 NOTE — Assessment & Plan Note (Signed)
Suprapubic,  With history of TAH.  Pelvic U/s and CA 125 ordered to rule out ovarian mass

## 2021-06-20 ENCOUNTER — Ambulatory Visit: Payer: Self-pay

## 2021-06-21 ENCOUNTER — Telehealth: Payer: Self-pay | Admitting: Internal Medicine

## 2021-06-21 DIAGNOSIS — E785 Hyperlipidemia, unspecified: Secondary | ICD-10-CM

## 2021-06-21 DIAGNOSIS — I10 Essential (primary) hypertension: Secondary | ICD-10-CM

## 2021-06-21 NOTE — Telephone Encounter (Signed)
Patient called and made 39m follow up with [facility] in May. [facility] wants to do labs, no orders have been placed.

## 2021-07-03 ENCOUNTER — Telehealth: Payer: Self-pay | Admitting: Internal Medicine

## 2021-07-03 MED ORDER — ALBUTEROL SULFATE HFA 108 (90 BASE) MCG/ACT IN AERS: 1.0000 | INHALATION_SPRAY | Freq: Four times a day (QID) | RESPIRATORY_TRACT | 0 refills | Status: DC | PRN

## 2021-07-03 NOTE — Telephone Encounter (Signed)
Refill call into [facility]. albuterol (PROVENTIL HFA;VENTOLIN HFA) 108 (90 Base) MCG/ACT inhaler Inhale 1-2 puffs into the lungs every 6 (six) hours as needed for wheezing or shortness

## 2021-07-12 NOTE — Telephone Encounter (Signed)
Pt is scheduled for a lab appt in May 2023. I have ordered Lipid panel, CMP and Microalbumin. Is there anything else that needs to be ordered?

## 2021-07-12 NOTE — Addendum Note (Signed)
Addended by: Adair Laundry on: 07/12/2021 04:00 PM   Modules accepted: Orders

## 2021-08-20 ENCOUNTER — Encounter: Payer: Self-pay | Admitting: Dermatology

## 2021-08-20 ENCOUNTER — Ambulatory Visit (INDEPENDENT_AMBULATORY_CARE_PROVIDER_SITE_OTHER): Payer: PRIVATE HEALTH INSURANCE | Admitting: Dermatology

## 2021-08-20 ENCOUNTER — Other Ambulatory Visit: Payer: Self-pay

## 2021-08-20 DIAGNOSIS — L988 Other specified disorders of the skin and subcutaneous tissue: Secondary | ICD-10-CM

## 2021-08-20 NOTE — Patient Instructions (Signed)

## 2021-08-20 NOTE — Progress Notes (Signed)
° °  Follow-Up Visit   Subjective  Tonya Gregory is a 61 y.o. female who presents for the following: Patient presents for Botox.  She would like to see if she needs fillers.  The following portions of the chart were reviewed this encounter and updated as appropriate:   Tobacco   Allergies   Meds   Problems   Med Hx   Surg Hx   Fam Hx      Review of Systems:  No other skin or systemic complaints except as noted in HPI or Assessment and Plan.  Objective  Well appearing patient in no apparent distress; mood and affect are within normal limits.  A focused examination was performed including face. Relevant physical exam findings are noted in the Assessment and Plan.  Head - Anterior (Face) Rhytides and volume loss.        Assessment & Plan  Elastosis of skin Head - Anterior (Face)  Botox 43 units injected today as marked:  - Frown complex 25 units - Brow lift 5 units each, total of 10 units - DAO's 4 units each, total of 8 units  Botox Injection - Head - Anterior (Face) Location: Frown complex, brow lift, DAO's  Informed consent: Discussed risks (infection, pain, bleeding, bruising, swelling, allergic reaction, paralysis of nearby muscles, eyelid droop, double vision, neck weakness, difficulty breathing, headache, undesirable cosmetic result, and need for additional treatment) and benefits of the procedure, as well as the alternatives.  Informed consent was obtained.  Preparation: The area was cleansed with alcohol.  Procedure Details:  Botox was injected into the dermis with a 30-gauge needle. Pressure applied to any bleeding. Ice packs offered for swelling.  Lot Number:  [id number] Expiration:  09/2023  Total Units Injected:  43  Plan: Patient was instructed to remain upright for 4 hours. Patient was instructed to avoid massaging the face and avoid vigorous exercise for the rest of the day. Tylenol may be used for headache.  Allow 2 weeks before returning to clinic for  additional dosing as needed. Patient will call for any problems.  In around May 2023, she may benefit from midface fillers and oral commissure fillers.  Return for 28m botox and fillers.  I, Othelia Pulling, RMA, am acting as scribe for Sarina Ser, MD . Documentation: I have reviewed the above documentation for accuracy and completeness, and I agree with the above.  Sarina Ser, MD

## 2021-08-22 LAB — HM PAP SMEAR: HM Pap smear: NORMAL

## 2021-09-18 ENCOUNTER — Ambulatory Visit: Payer: PRIVATE HEALTH INSURANCE | Admitting: Dermatology

## 2021-10-01 IMAGING — CT CT CARDIAC CORONARY ARTERY CALCIUM SCORE
3 series · 14 of 20 positions shown, 16 images · non-contrast
Comparison: None.
COMPARISON: None.

Addendum:
EXAM:
OVER-READ INTERPRETATION  CT CHEST

The following report is an over-read performed by radiologist Dr.
Maynard Lamour [REDACTED] on 05/02/2021. This over-read
does not include interpretation of cardiac or coronary anatomy or
pathology. The coronary calcium score interpretation by the
cardiologist is attached.
CLINICAL DATA: Risk stratification
Coronary Calcium Score
TECHNIQUE: The patient was scanned on a Siemens Somatom go.Top Scanner. Axial
non-contrast 3 mm slices were carried out through the heart. The
data set was analyzed on a dedicated work station and scored using
the Agatson method.

[Series 2: sa36 calcium scoring 3.00 · axial · 0.36mm/px · z∈[-1118,-1037]mm · 4 of 46 slices shown]
[im 10/46  vessel]
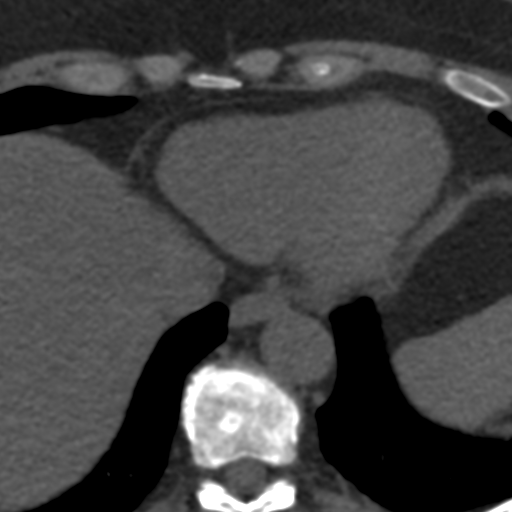
[im 19/46  vessel]
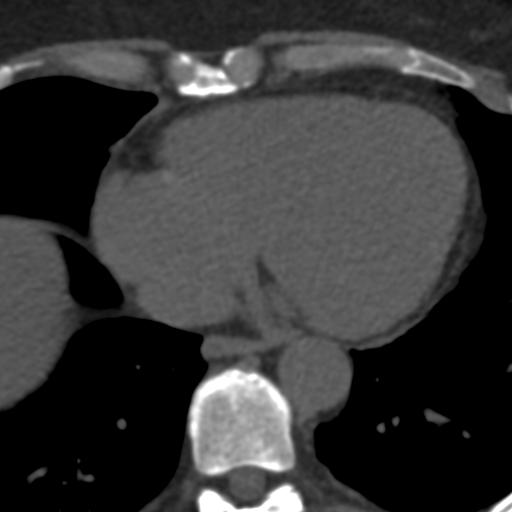
[im 28/46  vessel]
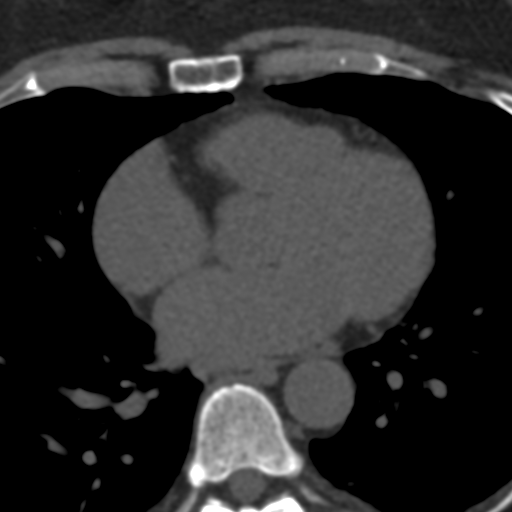
[im 37/46  vessel]
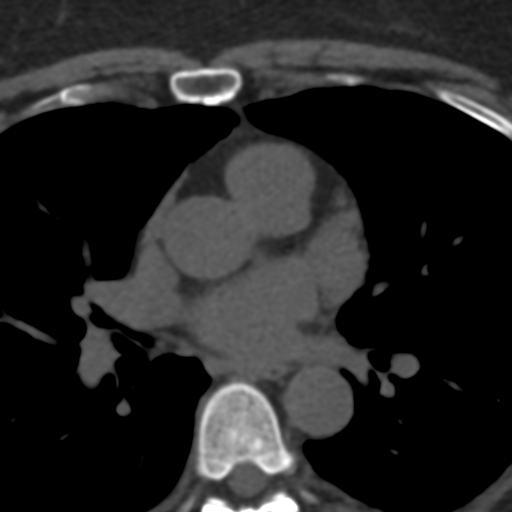

[Series 5: full fov st calcium scoring 3.00 · axial · 0.67mm/px · z∈[-1124,-1034]mm · 5 of 46 slices shown, 7 images]
[im 8/46  vessel]
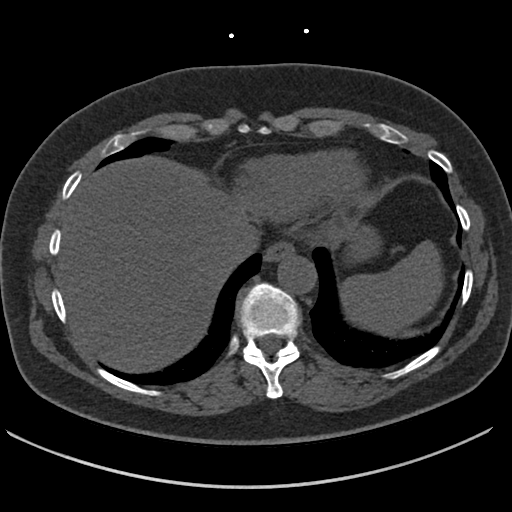
[im 8/46  lung]
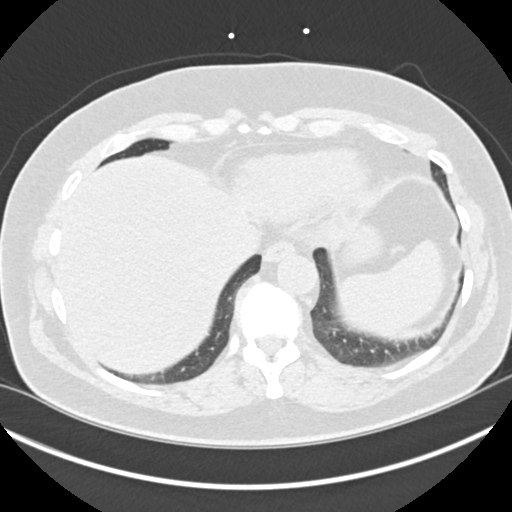
[im 16/46  vessel]
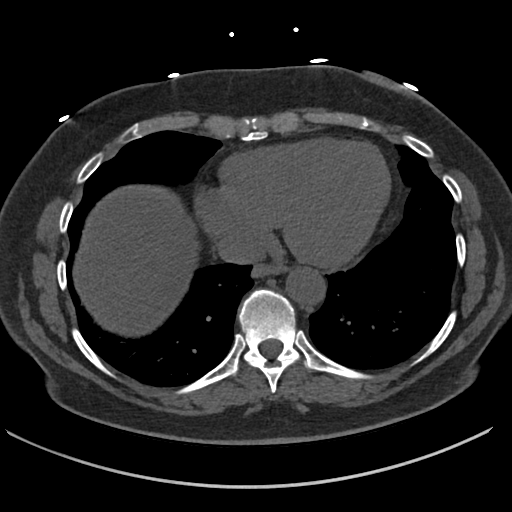
[im 23/46  vessel]
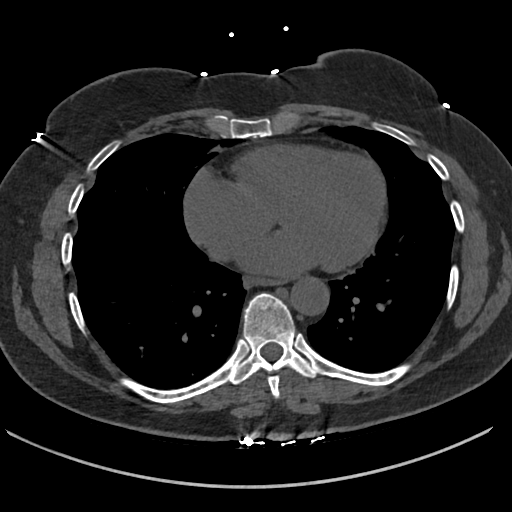
[im 31/46  vessel]
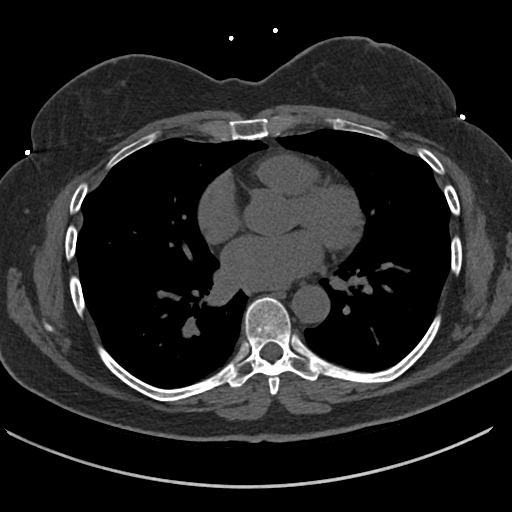
[im 38/46  vessel]
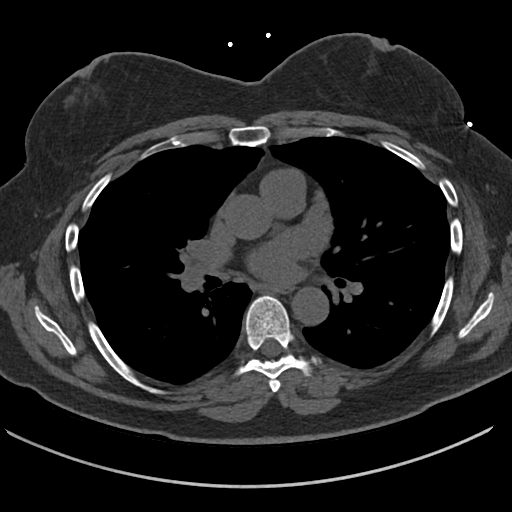
[im 38/46  lung]
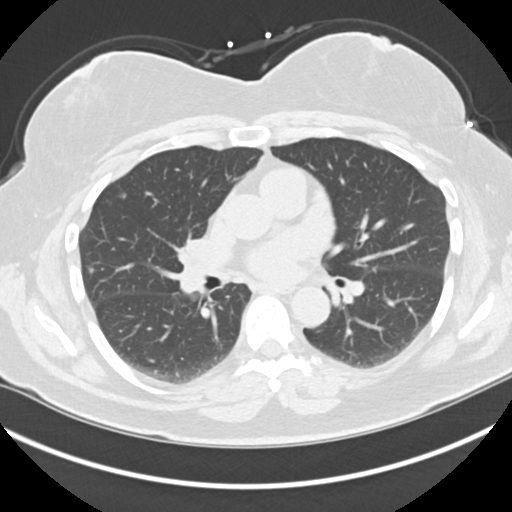

[Series 10: full fov lungs calcium scoring 3.00 ax · axial · 0.67mm/px · z∈[-1124,-1034]mm · 5 of 46 slices shown]
[im 8/46  vessel]
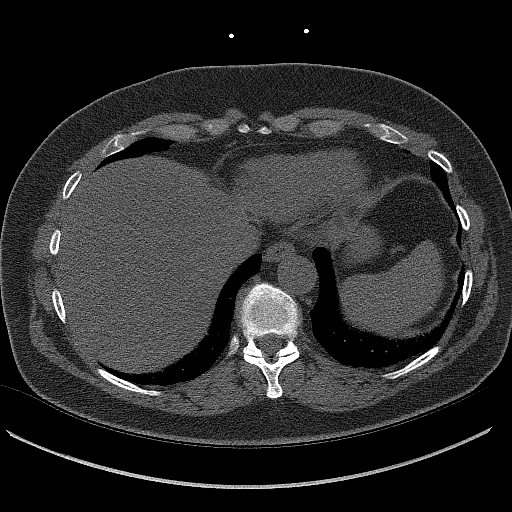
[im 16/46  vessel]
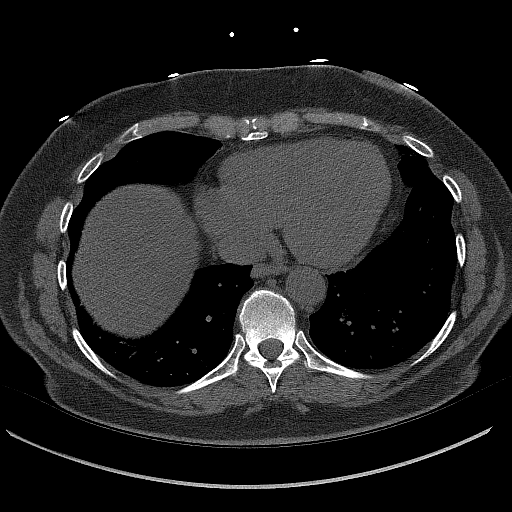
[im 23/46  vessel]
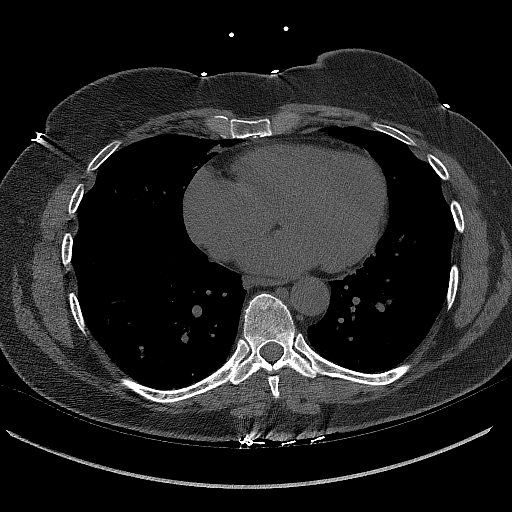
[im 31/46  vessel]
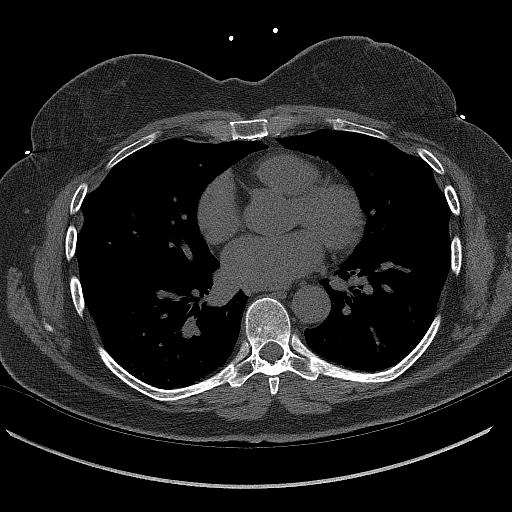
[im 38/46  vessel]
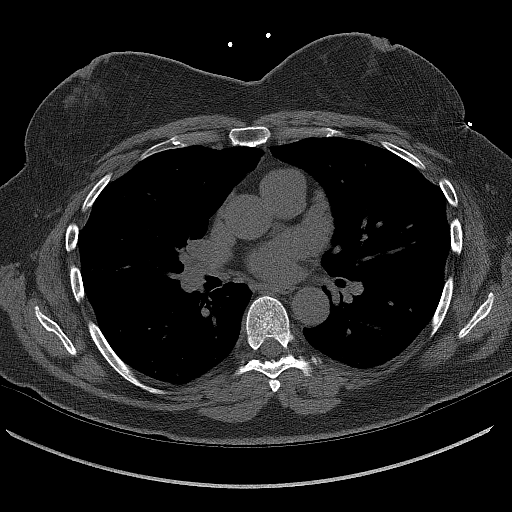

[14 of 20 positions shown; findings below may reference images not displayed]

FINDINGS: Vascular: Heart is normal size.  Aorta normal caliber.

Mediastinum/Nodes: No adenopathy

Lungs/Pleura: Right middle lobe nodule measures 5 mm on image 10. No
confluent opacities or effusions otherwise.

Upper Abdomen: Imaging into the upper abdomen demonstrates a large
low-density lesion in the left hepatic lobe measuring 8 cm. This is
not imaged in its entirety and cannot be characterized on this
noncontrast study.

Musculoskeletal: Chest wall soft tissues are unremarkable. No acute
bony abnormality.
IMPRESSION: 5 mm right middle lobe pulmonary nodule. No follow-up needed if
patient is low-risk. Non-contrast chest CT can be considered in 12
months if patient is high-risk. This recommendation follows the
consensus statement: Guidelines for Management of Incidental
Pulmonary Nodules Detected on CT Images: From the [HOSPITAL]

8 cm low-density lesion partially visualized in the left hepatic
lobe. This cannot be characterized on this noncontrast study. This
could be further evaluated with right upper quadrant ultrasound.
FINDINGS: Non-cardiac: See separate report from [REDACTED].

Ascending Aorta: Normal size

Pericardium: Normal

Coronary arteries: Normal origin of left and right coronary
arteries. Distribution of arterial calcifications if present, as
noted below;

LM 0

LAD

LCx 0

RCA 0

Total

IMPRESSION AND RECOMMENDATION:
1. Coronary calcium score of 4.79. This was 65th percentile for age
and sex matched control.

2. CAC 1-99 in LAD. JAKELIN SILER1/N1

3. Continue heart healthy lifestyle and risk factor modification.

Mofidul Tiger

*** End of Addendum ***
EXAM:
OVER-READ INTERPRETATION  CT CHEST

The following report is an over-read performed by radiologist Dr.
Maynard Lamour [REDACTED] on 05/02/2021. This over-read
does not include interpretation of cardiac or coronary anatomy or
pathology. The coronary calcium score interpretation by the
cardiologist is attached.
FINDINGS: Vascular: Heart is normal size.  Aorta normal caliber.

Mediastinum/Nodes: No adenopathy

Lungs/Pleura: Right middle lobe nodule measures 5 mm on image 10. No
confluent opacities or effusions otherwise.

Upper Abdomen: Imaging into the upper abdomen demonstrates a large
low-density lesion in the left hepatic lobe measuring 8 cm. This is
not imaged in its entirety and cannot be characterized on this
noncontrast study.

Musculoskeletal: Chest wall soft tissues are unremarkable. No acute
bony abnormality.
IMPRESSION: 5 mm right middle lobe pulmonary nodule. No follow-up needed if
patient is low-risk. Non-contrast chest CT can be considered in 12
months if patient is high-risk. This recommendation follows the
consensus statement: Guidelines for Management of Incidental
Pulmonary Nodules Detected on CT Images: From the [HOSPITAL]

8 cm low-density lesion partially visualized in the left hepatic
lobe. This cannot be characterized on this noncontrast study. This
could be further evaluated with right upper quadrant ultrasound.

## 2021-11-27 ENCOUNTER — Ambulatory Visit (INDEPENDENT_AMBULATORY_CARE_PROVIDER_SITE_OTHER): Payer: Self-pay | Admitting: Dermatology

## 2021-11-27 DIAGNOSIS — L988 Other specified disorders of the skin and subcutaneous tissue: Secondary | ICD-10-CM

## 2021-11-27 NOTE — Progress Notes (Signed)
? ?  Follow-Up Visit ?  ?Subjective  ?Tonya Gregory is a 61 y.o. female who presents for the following: Facial Elastosis (Botox and filler today). ? ?The following portions of the chart were reviewed this encounter and updated as appropriate:  ? Tobacco  Allergies  Meds  Problems  Med Hx  Surg Hx  Fam Hx   ?  ?Review of Systems:  No other skin or systemic complaints except as noted in HPI or Assessment and Plan. ? ?Objective  ?Well appearing patient in no apparent distress; mood and affect are within normal limits. ? ?A focused examination was performed including face. Relevant physical exam findings are noted in the Assessment and Plan. ? ?Face ?Rhytides and volume loss.  ? ? ? ? ? ? ? ? ? ? ? ? ? ?Assessment & Plan  ?Elastosis of skin ?Face ? ?Botox Injection - Face ?Location: See attached image ? ?Informed consent: Discussed risks (infection, pain, bleeding, bruising, swelling, allergic reaction, paralysis of nearby muscles, eyelid droop, double vision, neck weakness, difficulty breathing, headache, undesirable cosmetic result, and need for additional treatment) and benefits of the procedure, as well as the alternatives.  Informed consent was obtained. ? ?Preparation: The area was cleansed with alcohol. ? ?Procedure Details:  Botox was injected into the dermis with a 30-gauge needle. Pressure applied to any bleeding. Ice packs offered for swelling. ? ?Lot Number:  [id number] ?Expiration:  11/2023 ? ?Total Units Injected:  35 ? ?Plan: Patient was instructed to remain upright for 4 hours. Patient was instructed to avoid massaging the face and avoid vigorous exercise for the rest of the day. Tylenol may be used for headache.  Allow 2 weeks before returning to clinic for additional dosing as needed. Patient will call for any problems. ? ?Filling material injection - Face ?Prior to the procedure, the patient's past medical history, allergies and the rare but potential risks and complications were reviewed with  the patient and a signed consent was obtained. Pre and post-treatment care was discussed and instructions provided. ? ?Location: lips, corners of mouth and chin ? ?Filler Type: Restylane Refyne Lot #[id number] Exp 11/02/2022 ? ?Procedure: The area was prepped thoroughly with hibiclens After introducing the needle into the desired treatment area, the syringe plunger was drawn back to ensure there was no flash of blood prior to injecting the filler in order to minimize risk of intravascular injection and vascular occlusion. After injection of the filler, the treated areas were cleansed and iced to reduce swelling. Post-treatment instructions were reviewed with the patient.      ? ?Patient tolerated the procedure well. The patient will call with any problems, questions or concerns prior to their next appointment. ? ?Return for Botox and filler in 3-4 months. ? ?I, Ashok Cordia, CMA, am acting as scribe for Sarina Ser, MD . ?Documentation: I have reviewed the above documentation for accuracy and completeness, and I agree with the above. ? ?Sarina Ser, MD ? ?

## 2021-11-27 NOTE — Patient Instructions (Addendum)
If You Need Anything After Your Visit ? ?If you have any questions or concerns for your doctor, please call our main line at [phone] and press option 4 to reach your doctor's medical assistant. If no one answers, please leave a voicemail as directed and we will return your call as soon as possible. Messages left after 4 pm will be answered the following business day.  ? ?You may also send us a message via MyChart. We typically respond to MyChart messages within 1-2 business days. ? ?For prescription refills, please ask your pharmacy to contact our office. Our fax number is [phone]. ? ?If you have an urgent issue when the clinic is closed that cannot wait until the next business day, you can page your doctor at the number below.   ? ?Please note that while we do our best to be available for urgent issues outside of office hours, we are not available 24/7.  ? ?If you have an urgent issue and are unable to reach us, you may choose to seek medical care at your doctor's office, retail clinic, urgent care center, or emergency room. ? ?If you have a medical emergency, please immediately call 911 or go to the emergency department. ? ?Pager Numbers ? ?- Dr. Kowalski: [phone] ? ?- Dr. Moye: [phone] ? ?- Dr. Stewart: [phone] ? ?In the event of inclement weather, please call our main line at [phone] for an update on the status of any delays or closures. ? ?Dermatology Medication Tips: ?Please keep the boxes that topical medications come in in order to help keep track of the instructions about where and how to use these. Pharmacies typically print the medication instructions only on the boxes and not directly on the medication tubes.  ? ?If your medication is too expensive, please contact our office at [phone] option 4 or send us a message through MyChart.  ? ?We are unable to tell what your co-pay for medications will be in advance as this is different depending on your insurance coverage.  However, we may be able to find a substitute medication at lower cost or fill out paperwork to get insurance to cover a needed medication.  ? ?If a prior authorization is required to get your medication covered by your insurance company, please allow us 1-2 business days to complete this process. ? ?Drug prices often vary depending on where the prescription is filled and some pharmacies may offer cheaper prices. ? ?The website www.goodrx.com contains coupons for medications through different pharmacies. The prices here do not account for what the cost may be with help from insurance (it may be cheaper with your insurance), but the website can give you the price if you did not use any insurance.  ?- You can print the associated coupon and take it with your prescription to the pharmacy.  ?- You may also stop by our office during regular business hours and pick up a GoodRx coupon card.  ?- If you need your prescription sent electronically to a different pharmacy, notify our office through [facility] MyChart or by phone at [phone] option 4. ? ? ? ? ?Si Usted Necesita Algo Despu?s de Su Visita ? ?Tambi?n puede enviarnos un mensaje a trav?s de MyChart. Por lo general respondemos a los mensajes de MyChart en el transcurso de 1 a 2 d?as h?biles. ? ?Para renovar recetas, por favor pida a su farmacia que se ponga en contacto con nuestra oficina. Nuestro n?mero de fax es el [phone]. ? ?Si tiene   un asunto urgente cuando la cl?nica est? cerrada y que no puede esperar hasta el siguiente d?a h?bil, puede llamar/localizar a su doctor(a) al n?mero que aparece a continuaci?n.  ? ?Por favor, tenga en cuenta que aunque hacemos todo lo posible para estar disponibles para asuntos urgentes fuera del horario de oficina, no estamos disponibles las 24 horas del d?a, los 7 d?as de la semana.  ? ?Si tiene un problema urgente y no puede comunicarse con nosotros, puede optar por buscar atenci?n m?dica  en el consultorio de su  doctor(a), en una cl?nica privada, en un centro de atenci?n urgente o en una sala de emergencias. ? ?Si tiene Engineer, maintenance (IT) m?dica, por favor llame inmediatamente al 911 o vaya a la sala de emergencias. ? ?N?meros de b?per ? ?- Dr. Nehemiah Massed: [phone] ? ?- Dra. Moye: [phone] ? ?- Dra. Nicole Kindred: [phone] ? ?En caso de inclemencias del tiempo, por favor llame a nuestra l?nea principal al [phone] para una actualizaci?n sobre el estado de cualquier retraso o cierre. ? ?Consejos para la medicaci?n en dermatolog?a: ?Por favor, guarde las cajas en las que vienen los medicamentos de uso t?pico para ayudarle a seguir las instrucciones sobre d?nde y c?mo usarlos. Las farmacias generalmente imprimen las instrucciones del medicamento s?lo en las cajas y no directamente en los tubos del [place].  ? ?Si su medicamento es muy caro, por favor, p?ngase en contacto con Zigmund Daniel llamando al [phone] y presione la opci?n 4 o env?enos un mensaje a trav?s de MyChart.  ? ?No podemos decirle cu?l ser? su copago por los medicamentos por adelantado ya que esto es diferente dependiendo de la cobertura de su seguro. Sin embargo, es posible que podamos encontrar un medicamento sustituto a Electrical engineer un formulario para que el seguro cubra el medicamento que se considera necesario.  ? ?Si se requiere Ardelia Mems autorizaci?n previa para que su compa??a de seguros [place] su medicamento, por favor perm?tanos de 1 a 2 d?as h?biles para completar este proceso. ? ?Los precios de los medicamentos var?an con frecuencia dependiendo del Environmental consultant de d?nde se surte la receta y alguna farmacias pueden ofrecer precios m?s baratos. ? ?El sitio web www.goodrx.com tiene cupones para medicamentos de Airline pilot. Los precios aqu? no tienen en cuenta lo que podr?a costar con la ayuda del seguro (puede ser m?s barato con su seguro), pero el sitio web puede darle el precio si no utiliz? ning?n seguro.  ?- Puede imprimir el cup?n  correspondiente y llevarlo con su receta a la farmacia.  ?- Tambi?n puede pasar por nuestra oficina durante el horario de atenci?n regular y recoger una tarjeta de cupones de GoodRx.  ?- Si necesita que su receta se env?e electr?nicamente a Chiropodist, informe a nuestra oficina a trav?s de MyChart de [facility] o por tel?fono llamando al [phone] y presione la opci?n 4. Dry Skin Care ? ?What causes dry skin? ? ?Dry skin is common and results from inadequate moisture in the outer skin layers. Dry skin usually results from the excessive loss of moisture from the skin surface. This occurs due to two major factors: ?Normally the skin's oil glands deposit a layer of oil on the skin's surface. This layer of oil prevents the loss of moisture from the skin. Exposure to soaps, cleaners, solvents, and disinfectants removes this oily film, allowing water to escape. ?Water loss from the skin increases when the humidity is low. During winter months we spend a lot of time indoors where the air  is heated. Heated air has very low humidity. This also contributes to dry skin. ? ?A tendency for dry skin may accompany such disorders as eczema. Also, as people age, the number of functioning oil glands decreases, and the tendency toward dry skin can be a sensation of skin tightness when emerging from the shower. ? ?How do I manage dry skin? ? ?Humidify your environment. This can be accomplished by using a humidifier in your bedroom at night during winter months. ?Bathing can actually put moisture back into your skin if done right. Take the following steps while bathing to sooth dry skin: ?Avoid hot water, which only dries the skin and makes itching worse. Use warm water. ?Avoid washcloths or extensive rubbing or scrubbing. ?Use mild soaps like unscented Dove, Oil of Olay, Cetaphil, Basis, or CeraVe. ?If you take baths rather than showers, rinse off soap residue with clean water before getting out of tub. ?Once out of the  shower/tub, pat dry gently with a soft towel. Leave your skin damp. ?While still damp, apply any medicated ointment/cream you were prescribed to the affected areas. After you apply your medicated ointment/crea

## 2021-12-04 ENCOUNTER — Encounter: Payer: Self-pay | Admitting: Dermatology

## 2021-12-20 ENCOUNTER — Other Ambulatory Visit (INDEPENDENT_AMBULATORY_CARE_PROVIDER_SITE_OTHER): Payer: Self-pay

## 2021-12-20 DIAGNOSIS — R7401 Elevation of levels of liver transaminase levels: Secondary | ICD-10-CM

## 2021-12-20 DIAGNOSIS — R140 Abdominal distension (gaseous): Secondary | ICD-10-CM

## 2021-12-20 DIAGNOSIS — I10 Essential (primary) hypertension: Secondary | ICD-10-CM

## 2021-12-20 DIAGNOSIS — E785 Hyperlipidemia, unspecified: Secondary | ICD-10-CM

## 2021-12-20 LAB — COMPREHENSIVE METABOLIC PANEL
ALT: 39 U/L — ABNORMAL HIGH (ref 0–35)
AST: 24 U/L (ref 0–37)
Albumin: 4.5 g/dL (ref 3.5–5.2)
Alkaline Phosphatase: 48 U/L (ref 39–117)
BUN: 19 mg/dL (ref 6–23)
CO2: 26 mEq/L (ref 19–32)
Calcium: 9.3 mg/dL (ref 8.4–10.5)
Chloride: 102 mEq/L (ref 96–112)
Creatinine, Ser: 0.93 mg/dL (ref 0.40–1.20)
GFR: 66.23 mL/min (ref >60.00)
Glucose, Bld: 113 mg/dL — ABNORMAL HIGH (ref 70–99)
Potassium: 4.6 mEq/L (ref 3.5–5.1)
Sodium: 137 mEq/L (ref 135–145)
Total Bilirubin: 1 mg/dL (ref 0.2–1.2)
Total Protein: 7 g/dL (ref 6.0–8.3)

## 2021-12-20 LAB — MICROALBUMIN / CREATININE URINE RATIO
Creatinine,U: 49.9 mg/dL
Microalb Creat Ratio: 1.4 mg/g (ref 0.0–30.0)
Microalb, Ur: <0.7 mg/dL (ref 0.0–1.9)

## 2021-12-20 LAB — LIPID PANEL
Cholesterol: 206 mg/dL — ABNORMAL HIGH (ref 0–200)
HDL: 39.1 mg/dL (ref >39.00)
NonHDL: 167.27
Total CHOL/HDL Ratio: 5
Triglycerides: 230 mg/dL — ABNORMAL HIGH (ref 0.0–149.0)
VLDL: 46 mg/dL — ABNORMAL HIGH (ref 0.0–40.0)

## 2021-12-20 LAB — LDL CHOLESTEROL, DIRECT: Direct LDL: 139 mg/dL

## 2021-12-23 ENCOUNTER — Ambulatory Visit (INDEPENDENT_AMBULATORY_CARE_PROVIDER_SITE_OTHER): Payer: Self-pay | Admitting: Internal Medicine

## 2021-12-23 ENCOUNTER — Other Ambulatory Visit: Payer: Self-pay | Admitting: Internal Medicine

## 2021-12-23 ENCOUNTER — Encounter: Payer: Self-pay | Admitting: Internal Medicine

## 2021-12-23 VITALS — BP 120/60 | HR 67 | Temp 97.9°F | Ht 68.25 in | Wt 184.2 lb

## 2021-12-23 DIAGNOSIS — E782 Mixed hyperlipidemia: Secondary | ICD-10-CM

## 2021-12-23 DIAGNOSIS — K760 Fatty (change of) liver, not elsewhere classified: Secondary | ICD-10-CM

## 2021-12-23 DIAGNOSIS — R140 Abdominal distension (gaseous): Secondary | ICD-10-CM

## 2021-12-23 DIAGNOSIS — I10 Essential (primary) hypertension: Secondary | ICD-10-CM

## 2021-12-23 LAB — CA 125: CA 125: 5 U/mL (ref <35)

## 2021-12-23 MED ORDER — FLUCONAZOLE 150 MG PO TABS: 150.0000 mg | ORAL_TABLET | Freq: Every day | ORAL | 0 refills | Status: DC

## 2021-12-23 NOTE — Assessment & Plan Note (Signed)
Suprapubic,  With history of TAH.  Pelvic U/s and CA 125 ordered to rule out ovarian mass

## 2021-12-23 NOTE — Patient Instructions (Addendum)
WE DISCUSSED Red Yeast Rice as a natural remedy, along with a low glycemic index diet,  For your borderline elevated cholesterol.   Red Yeast Rice has not been proven to prevent heart attacks  But it  does lower cholesterol, so if you want to try it , the dose is 600 mg twice daily in capsule form, available OTC.    We will repeat labs in 3 months  and if the LDL is > 160 I would recommend a trial of the Red Yeast Rice   Fluconazole treats Candida albicans vaginitis  Boric acid capsules treat Candida glabrata vaginitis  (available at [facility]) omp

## 2021-12-23 NOTE — Assessment & Plan Note (Signed)
Well controlled on current regimen. Renal function stable, no changes today. 

## 2021-12-23 NOTE — Progress Notes (Signed)
Subjective:  Patient ID: Tonya Gregory, female    DOB: 1961/03/26  Age: 61 y.o. MRN: [id number]  CC: The primary encounter diagnosis was Moderate mixed hyperlipidemia not requiring statin therapy. Diagnoses of Hepatic steatosis, Bloating symptom, and Primary hypertension were also pertinent to this visit.   HPI Tonya Gregory presents for follow up on hypertension and fatty liver.   Chief Complaint  Patient presents with   Follow-up    6 month follow up    Menopause:  managed with testosterone pellet implantation every 3 months  for the past 5-6 years. Feels much better ,  but worried about her cholesterol   Hypothyroidism: taking armour  managed by other office   HTN; taking lisinopril  Fatty liver:  alt elevated after recent vacation  in the [place].  Exessive alcohol and eating     Outpatient Medications Prior to Visit  Medication Sig Dispense Refill   acetaminophen (TYLENOL) 500 MG tablet Take 500 mg by mouth every 6 (six) hours as needed (for pain/headaches.).     albuterol (VENTOLIN HFA) 108 (90 Base) MCG/ACT inhaler Inhale 1-2 puffs into the lungs every 6 (six) hours as needed for wheezing or shortness of breath. 18 g 0   ibuprofen (ADVIL,MOTRIN) 200 MG tablet Take 400 mg by mouth every 8 (eight) hours as needed (for pain/headaches.).     lisinopril (PRINIVIL,ZESTRIL) 10 MG tablet Take 10 mg by mouth every morning.      Nutritional Supplements (JUICE PLUS FIBRE PO) Take 6 capsules by mouth daily. Take 2 tablet Juice Plus Fruit, 2 tablets of Vegetable, & 2 tablets of Berry Blend in the morning     Omega-3 Fatty Acids (FISH OIL PO) Take 1 capsule by mouth daily.     PRESCRIPTION MEDICATION PT GETTING TESTOSTERONE PELLETS INJECTED AT [facility] IN [facility] WITH DR Ellison Carwin INJECTED DEC 2018     progesterone (PROMETRIUM) 200 MG capsule Take 200 mg by mouth at bedtime.     thyroid (ARMOUR) 15 MG tablet Take 15 mg by mouth daily.     No facility-administered  medications prior to visit.    Review of Systems;  Patient denies headache, fevers, malaise, unintentional weight loss, skin rash, eye pain, sinus congestion and sinus pain, sore throat, dysphagia,  hemoptysis , cough, dyspnea, wheezing, chest pain, palpitations, orthopnea, edema, abdominal pain, nausea, melena, diarrhea, constipation, flank pain, dysuria, hematuria, urinary  Frequency, nocturia, numbness, tingling, seizures,  Focal weakness, Loss of consciousness,  Tremor, insomnia, depression, anxiety, and suicidal ideation.      Objective:  BP 120/60 (BP Location: Left Arm, Patient Position: Sitting, Cuff Size: Large)   Pulse 67   Temp 97.9 F (36.6 C) (Oral)   Ht 5' 8.25" (1.734 m)   Wt 184 lb 3.2 oz (83.6 kg)   LMP 11/15/2014   SpO2 98%   BMI 27.80 kg/m   BP Readings from Last 3 Encounters:  12/23/21 120/60  06/11/21 132/80  08/31/17 135/79    Wt Readings from Last 3 Encounters:  12/23/21 184 lb 3.2 oz (83.6 kg)  06/11/21 185 lb 9.6 oz (84.2 kg)  08/31/17 180 lb 6.4 oz (81.8 kg)    General appearance: alert, cooperative and appears stated age Ears: normal TM's and external ear canals both ears Throat: lips, mucosa, and tongue normal; teeth and gums normal Neck: no adenopathy, no carotid bruit, supple, symmetrical, trachea midline and thyroid not enlarged, symmetric, no tenderness/mass/nodules Back: symmetric, no curvature. ROM normal. No CVA tenderness. Lungs:  clear to auscultation bilaterally Heart: regular rate and rhythm, S1, S2 normal, no murmur, click, rub or gallop Abdomen: soft, non-tender; bowel sounds normal; no masses,  no organomegaly Pulses: 2+ and symmetric Skin: Skin color, texture, turgor normal. No rashes or lesions Lymph nodes: Cervical, supraclavicular, and axillary nodes normal.  Lab Results  Component Value Date   HGBA1C 5.8 06/07/2021    Lab Results  Component Value Date   CREATININE 0.93 12/20/2021   CREATININE 0.94 06/07/2021    CREATININE 0.80 08/25/2017    Lab Results  Component Value Date   WBC 5.5 06/07/2021   HGB 14.3 06/07/2021   HCT 42.1 06/07/2021   PLT 181.0 06/07/2021   GLUCOSE 113 (H) 12/20/2021   CHOL 206 (H) 12/20/2021   TRIG 230.0 (H) 12/20/2021   HDL 39.10 12/20/2021   LDLDIRECT 139.0 12/20/2021   LDLCALC 135 (H) 06/07/2021   ALT 39 (H) 12/20/2021   AST 24 12/20/2021   NA 137 12/20/2021   K 4.6 12/20/2021   CL 102 12/20/2021   CREATININE 0.93 12/20/2021   BUN 19 12/20/2021   CO2 26 12/20/2021   TSH 1.18 06/07/2021   HGBA1C 5.8 06/07/2021   MICROALBUR <0.7 12/20/2021    US Abdomen Complete  Result Date: 06/06/2021 CLINICAL DATA:  Hepatic cyst seen on cardiac CT. EXAM: ABDOMEN ULTRASOUND COMPLETE COMPARISON:  CT cardiac 05/02/2021 FINDINGS: Gallbladder: There is a small mobile nonshadowing echogenic focus, possibly a small amount of sludge. No definite gallstones identified. No gallbladder wall thickening. Negative sonographic Murphy sign. Common bile duct: Diameter: 0.5 cm, within normal limits Liver: There is a large simple appearing cyst in the left hepatic lobe measuring 9.8 x 6.4 x 11.5 cm. Diffusely increased parenchymal echogenicity. Portal vein is patent on color Doppler imaging with normal direction of blood flow towards the liver. IVC: No abnormality visualized. Pancreas: Visualized portion unremarkable. Spleen: Size and appearance within normal limits. Right Kidney: Length: 10.7 cm. Echogenicity within normal limits. No mass or hydronephrosis visualized. Left Kidney: Length: 15.6 cm. Echogenicity within normal limits. No mass or hydronephrosis visualized. Abdominal aorta: No aneurysm visualized. Other findings: None. IMPRESSION: 1. Large simple appearing cyst in the left hepatic lobe measuring up to 11.5 cm. 2. Diffusely increased liver parenchymal echogenicity which is nonspecific but most commonly seen with hepatic steatosis. Electronically Signed   By: Audie Pinto M.D.   On:  06/06/2021 16:57    Assessment & Plan:   Problem List Items Addressed This Visit     Hypertension    Well controlled on current regimen. Renal function stable, no changes today.       Hepatic steatosis    Annual visit with Der Gerald Dexter reviewed.  Repeat liver enzymes needed In a few weeks.        Relevant Orders   Comprehensive metabolic panel   Bloating symptom    Suprapubic,  With history of TAH.  Pelvic U/s and CA 125 ordered to rule out ovarian mass        Other Visit Diagnoses     Moderate mixed hyperlipidemia not requiring statin therapy    -  Primary   Relevant Orders   Lipid panel       I spent a total of 38mnutes with this patient in a face to face visit on the date of this encounter reviewing the last office visit with me one year go, her  most recent with patient's hepatologist ,  patient'ss diet and eating habits, home blood pressure readings ,  most recent imaging study ,   and post visit ordering of testing and therapeutics.    Follow-up: No follow-ups on file.   Crecencio Mc, MD

## 2021-12-23 NOTE — Addendum Note (Signed)
Addended by: Leeanne Rio on: 12/23/2021 11:11 AM   Modules accepted: Orders

## 2021-12-23 NOTE — Assessment & Plan Note (Addendum)
Annual visit with Der Gerald Dexter reviewed.  Repeat liver enzymes needed In a few weeks.

## 2021-12-25 NOTE — Addendum Note (Signed)
Addended by: Crecencio Mc on: 12/25/2021 08:37 AM   Modules accepted: Orders

## 2022-03-04 ENCOUNTER — Other Ambulatory Visit: Payer: Self-pay | Admitting: Obstetrics and Gynecology

## 2022-03-04 DIAGNOSIS — Z1231 Encounter for screening mammogram for malignant neoplasm of breast: Secondary | ICD-10-CM

## 2022-03-13 ENCOUNTER — Ambulatory Visit (INDEPENDENT_AMBULATORY_CARE_PROVIDER_SITE_OTHER): Payer: Self-pay | Admitting: Dermatology

## 2022-03-13 DIAGNOSIS — L988 Other specified disorders of the skin and subcutaneous tissue: Secondary | ICD-10-CM

## 2022-03-13 NOTE — Progress Notes (Signed)
   Follow-Up Visit   Subjective  Tonya Gregory is a 61 y.o. female who presents for the following: Facial Elastosis (Patient is here today for Botox and fillers).  The following portions of the chart were reviewed this encounter and updated as appropriate:   Tobacco  Allergies  Meds  Problems  Med Hx  Surg Hx  Fam Hx     Review of Systems:  No other skin or systemic complaints except as noted in HPI or Assessment and Plan.  Objective  Well appearing patient in no apparent distress; mood and affect are within normal limits.  A focused examination was performed including the face. Relevant physical exam findings are noted in the Assessment and Plan.  Face Rhytides and volume loss.                     Assessment & Plan  Elastosis of skin Face Filling material injection - Face Prior to the procedure, the patient's past medical history, allergies and the rare but potential risks and complications were reviewed with the patient and a signed consent was obtained. Pre and post-treatment care was discussed and instructions provided.  Risks including vascular occlusion were discussed.   Location: See attached photo  Filler Type: Juvederm Voluma  Lot number: [id number]  Expiration date:09/11/2022  Procedure: The area was prepped thoroughly with Puracyn. After introducing the needle into the desired treatment area, the syringe plunger was drawn back to ensure there was no flash of blood prior to injecting the filler in order to minimize risk of intravascular injection and vascular occlusion. After injection of the filler, the treated areas were cleansed and iced to reduce swelling. Post-treatment instructions were reviewed with the patient.       Patient tolerated the procedure well. The patient will call with any problems, questions or concerns prior to their next appointment.  Botox Injection - Face Location: See attached image  Informed consent: Discussed  risks (infection, pain, bleeding, bruising, swelling, allergic reaction, paralysis of nearby muscles, eyelid droop, double vision, neck weakness, difficulty breathing, headache, undesirable cosmetic result, and need for additional treatment) and benefits of the procedure, as well as the alternatives.  Informed consent was obtained.  Preparation: The area was cleansed with alcohol.  Procedure Details:  Botox was injected into the dermis with a 30-gauge needle. Pressure applied to any bleeding. Ice packs offered for swelling.  Lot Number:  [id number] Expiration:  06/2024  Total Units Injected:  35  Plan: Patient was instructed to remain upright for 4 hours. Patient was instructed to avoid massaging the face and avoid vigorous exercise for the rest of the day. Tylenol may be used for headache.  Allow 2 weeks before returning to clinic for additional dosing as needed. Patient will call for any problems.  Return in about 3 months (around 06/13/2022) for Botox injections.  Luther Redo, CMA, am acting as scribe for Sarina Ser, MD . Documentation: I have reviewed the above documentation for accuracy and completeness, and I agree with the above.  Sarina Ser, MD

## 2022-03-13 NOTE — Patient Instructions (Signed)
Due to recent changes in healthcare laws, you may see results of your pathology and/or laboratory studies on MyChart before the doctors have had a chance to review them. We understand that in some cases there may be results that are confusing or concerning to you. Please understand that not all results are received at the same time and often the doctors may need to interpret multiple results in order to provide you with the best plan of care or course of treatment. Therefore, we ask that you please give us 2 business days to thoroughly review all your results before contacting the office for clarification. Should we see a critical lab result, you will be contacted sooner.   If You Need Anything After Your Visit  If you have any questions or concerns for your doctor, please call our main line at [phone] and press option 4 to reach your doctor's medical assistant. If no one answers, please leave a voicemail as directed and we will return your call as soon as possible. Messages left after 4 pm will be answered the following business day.   You may also send us a message via MyChart. We typically respond to MyChart messages within 1-2 business days.  For prescription refills, please ask your pharmacy to contact our office. Our fax number is [phone].  If you have an urgent issue when the clinic is closed that cannot wait until the next business day, you can page your doctor at the number below.    Please note that while we do our best to be available for urgent issues outside of office hours, we are not available 24/7.   If you have an urgent issue and are unable to reach us, you may choose to seek medical care at your doctor's office, retail clinic, urgent care center, or emergency room.  If you have a medical emergency, please immediately call 911 or go to the emergency department.  Pager Numbers  - Dr. Kowalski: [phone]  - Dr. Moye: [phone]  - Dr. Stewart:  [phone]  In the event of inclement weather, please call our main line at [phone] for an update on the status of any delays or closures.  Dermatology Medication Tips: Please keep the boxes that topical medications come in in order to help keep track of the instructions about where and how to use these. Pharmacies typically print the medication instructions only on the boxes and not directly on the medication tubes.   If your medication is too expensive, please contact our office at [phone] option 4 or send us a message through MyChart.   We are unable to tell what your co-pay for medications will be in advance as this is different depending on your insurance coverage. However, we may be able to find a substitute medication at lower cost or fill out paperwork to get insurance to cover a needed medication.   If a prior authorization is required to get your medication covered by your insurance company, please allow us 1-2 business days to complete this process.  Drug prices often vary depending on where the prescription is filled and some pharmacies may offer cheaper prices.  The website www.goodrx.com contains coupons for medications through different pharmacies. The prices here do not account for what the cost may be with help from insurance (it may be cheaper with your insurance), but the website can give you the price if you did not use any insurance.  - You can print the associated coupon and take it with   your prescription to the pharmacy.  - You may also stop by our office during regular business hours and pick up a GoodRx coupon card.  - If you need your prescription sent electronically to a different pharmacy, notify our office through [facility] MyChart or by phone at [phone] option 4.     Si Usted Necesita Algo Despus de Su Visita  Tambin puede enviarnos un mensaje a travs de MyChart. Por lo general respondemos a los mensajes de MyChart en el transcurso de 1 a 2  das hbiles.  Para renovar recetas, por favor pida a su farmacia que se ponga en contacto con nuestra oficina. Nuestro nmero de fax es el [phone].  Si tiene un asunto urgente cuando la clnica est cerrada y que no puede esperar hasta el siguiente da hbil, puede llamar/localizar a su doctor(a) al nmero que aparece a continuacin.   Por favor, tenga en cuenta que aunque hacemos todo lo posible para estar disponibles para asuntos urgentes fuera del horario de oficina, no estamos disponibles las 24 horas del da, los 7 das de la semana.   Si tiene un problema urgente y no puede comunicarse con nosotros, puede optar por buscar atencin mdica  en el consultorio de su doctor(a), en una clnica privada, en un centro de atencin urgente o en una sala de emergencias.  Si tiene una emergencia mdica, por favor llame inmediatamente al 911 o vaya a la sala de emergencias.  Nmeros de bper  - Dr. Kowalski: [phone]  - Dra. Moye: [phone]  - Dra. Stewart: [phone]  En caso de inclemencias del tiempo, por favor llame a nuestra lnea principal al [phone] para una actualizacin sobre el estado de cualquier retraso o cierre.  Consejos para la medicacin en dermatologa: Por favor, guarde las cajas en las que vienen los medicamentos de uso tpico para ayudarle a seguir las instrucciones sobre dnde y cmo usarlos. Las farmacias generalmente imprimen las instrucciones del medicamento slo en las cajas y no directamente en los tubos del medicamento.   Si su medicamento es muy caro, por favor, pngase en contacto con nuestra oficina llamando al [phone] y presione la opcin 4 o envenos un mensaje a travs de MyChart.   No podemos decirle cul ser su copago por los medicamentos por adelantado ya que esto es diferente dependiendo de la cobertura de su seguro. Sin embargo, es posible que podamos encontrar un medicamento sustituto a menor costo o llenar un formulario para que el  seguro cubra el medicamento que se considera necesario.   Si se requiere una autorizacin previa para que su compaa de seguros cubra su medicamento, por favor permtanos de 1 a 2 das hbiles para completar este proceso.  Los precios de los medicamentos varan con frecuencia dependiendo del lugar de dnde se surte la receta y alguna farmacias pueden ofrecer precios ms baratos.  El sitio web www.goodrx.com tiene cupones para medicamentos de diferentes farmacias. Los precios aqu no tienen en cuenta lo que podra costar con la ayuda del seguro (puede ser ms barato con su seguro), pero el sitio web puede darle el precio si no utiliz ningn seguro.  - Puede imprimir el cupn correspondiente y llevarlo con su receta a la farmacia.  - Tambin puede pasar por nuestra oficina durante el horario de atencin regular y recoger una tarjeta de cupones de GoodRx.  - Si necesita que su receta se enve electrnicamente a una farmacia diferente, informe a nuestra oficina a travs de MyChart de [facility]   o por telfono llamando al [phone] y presione la opcin 4.  

## 2022-03-17 ENCOUNTER — Encounter: Payer: Self-pay | Admitting: Dermatology

## 2022-03-26 ENCOUNTER — Ambulatory Visit
Admission: RE | Admit: 2022-03-26 | Discharge: 2022-03-26 | Disposition: A | Discharge status: Home / Self Care | Payer: No Typology Code available for payment source | Source: Ambulatory Visit | Attending: Obstetrics and Gynecology | Admitting: Obstetrics and Gynecology

## 2022-03-26 DIAGNOSIS — Z1231 Encounter for screening mammogram for malignant neoplasm of breast: Secondary | ICD-10-CM

## 2022-04-21 ENCOUNTER — Other Ambulatory Visit (INDEPENDENT_AMBULATORY_CARE_PROVIDER_SITE_OTHER): Payer: Self-pay

## 2022-04-21 DIAGNOSIS — R7401 Elevation of levels of liver transaminase levels: Secondary | ICD-10-CM

## 2022-04-21 DIAGNOSIS — K760 Fatty (change of) liver, not elsewhere classified: Secondary | ICD-10-CM

## 2022-04-21 DIAGNOSIS — E782 Mixed hyperlipidemia: Secondary | ICD-10-CM

## 2022-04-21 LAB — COMPREHENSIVE METABOLIC PANEL
ALT: 32 U/L (ref 0–35)
AST: 19 U/L (ref 0–37)
Albumin: 4.2 g/dL (ref 3.5–5.2)
Alkaline Phosphatase: 58 U/L (ref 39–117)
BUN: 19 mg/dL (ref 6–23)
CO2: 23 mEq/L (ref 19–32)
Calcium: 9.1 mg/dL (ref 8.4–10.5)
Chloride: 105 mEq/L (ref 96–112)
Creatinine, Ser: 0.97 mg/dL (ref 0.40–1.20)
GFR: 62.82 mL/min (ref >60.00)
Glucose, Bld: 115 mg/dL — ABNORMAL HIGH (ref 70–99)
Potassium: 4.4 mEq/L (ref 3.5–5.1)
Sodium: 137 mEq/L (ref 135–145)
Total Bilirubin: 0.7 mg/dL (ref 0.2–1.2)
Total Protein: 7 g/dL (ref 6.0–8.3)

## 2022-04-21 LAB — LIPID PANEL
Cholesterol: 188 mg/dL (ref 0–200)
HDL: 37.9 mg/dL — ABNORMAL LOW (ref >39.00)
LDL Cholesterol: 116 mg/dL — ABNORMAL HIGH (ref 0–99)
NonHDL: 149.83
Total CHOL/HDL Ratio: 5
Triglycerides: 170 mg/dL — ABNORMAL HIGH (ref 0.0–149.0)
VLDL: 34 mg/dL (ref 0.0–40.0)

## 2022-06-24 ENCOUNTER — Ambulatory Visit (INDEPENDENT_AMBULATORY_CARE_PROVIDER_SITE_OTHER): Payer: Self-pay | Admitting: Dermatology

## 2022-06-24 DIAGNOSIS — L988 Other specified disorders of the skin and subcutaneous tissue: Secondary | ICD-10-CM

## 2022-06-24 NOTE — Progress Notes (Signed)
   Follow-Up Visit   Subjective  Tonya Gregory is a 61 y.o. female who presents for the following: Facial Elastosis (Patient is here today for Botox injections).  The following portions of the chart were reviewed this encounter and updated as appropriate:   Tobacco  Allergies  Meds  Problems  Med Hx  Surg Hx  Fam Hx     Review of Systems:  No other skin or systemic complaints except as noted in HPI or Assessment and Plan.  Objective  Well appearing patient in no apparent distress; mood and affect are within normal limits.  A focused examination was performed including the face. Relevant physical exam findings are noted in the Assessment and Plan.  Face Rhytides and volume loss.       Assessment & Plan  Elastosis of skin Face  Botox 35 units injected as marked: - Frown complex 25 units - B/L brow lift 5 units each   Botox Injection - Face Location: See attached image  Informed consent: Discussed risks (infection, pain, bleeding, bruising, swelling, allergic reaction, paralysis of nearby muscles, eyelid droop, double vision, neck weakness, difficulty breathing, headache, undesirable cosmetic result, and need for additional treatment) and benefits of the procedure, as well as the alternatives.  Informed consent was obtained.  Preparation: The area was cleansed with alcohol.  Procedure Details:  Botox was injected into the dermis with a 30-gauge needle. Pressure applied to any bleeding. Ice packs offered for swelling.  Lot Number:  [id number] Expiration:  09/2024  Total Units Injected:  35  Plan: Patient was instructed to remain upright for 4 hours. Patient was instructed to avoid massaging the face and avoid vigorous exercise for the rest of the day. Tylenol may be used for headache.  Allow 2 weeks before returning to clinic for additional dosing as needed. Patient will call for any problems.    Return in about 4 months (around 10/23/2022) for Botox  injections.  Luther Redo, CMA, am acting as scribe for Sarina Ser, MD . Documentation: I have reviewed the above documentation for accuracy and completeness, and I agree with the above.  Sarina Ser, MD

## 2022-06-24 NOTE — Patient Instructions (Signed)
Due to recent changes in healthcare laws, you may see results of your pathology and/or laboratory studies on MyChart before the doctors have had a chance to review them. We understand that in some cases there may be results that are confusing or concerning to you. Please understand that not all results are received at the same time and often the doctors may need to interpret multiple results in order to provide you with the best plan of care or course of treatment. Therefore, we ask that you please give us 2 business days to thoroughly review all your results before contacting the office for clarification. Should we see a critical lab result, you will be contacted sooner.   If You Need Anything After Your Visit  If you have any questions or concerns for your doctor, please call our main line at [phone] and press option 4 to reach your doctor's medical assistant. If no one answers, please leave a voicemail as directed and we will return your call as soon as possible. Messages left after 4 pm will be answered the following business day.   You may also send us a message via MyChart. We typically respond to MyChart messages within 1-2 business days.  For prescription refills, please ask your pharmacy to contact our office. Our fax number is [phone].  If you have an urgent issue when the clinic is closed that cannot wait until the next business day, you can page your doctor at the number below.    Please note that while we do our best to be available for urgent issues outside of office hours, we are not available 24/7.   If you have an urgent issue and are unable to reach us, you may choose to seek medical care at your doctor's office, retail clinic, urgent care center, or emergency room.  If you have a medical emergency, please immediately call 911 or go to the emergency department.  Pager Numbers  - Dr. Kowalski: [phone]  - Dr. Moye: [phone]  - Dr. Stewart:  [phone]  In the event of inclement weather, please call our main line at [phone] for an update on the status of any delays or closures.  Dermatology Medication Tips: Please keep the boxes that topical medications come in in order to help keep track of the instructions about where and how to use these. Pharmacies typically print the medication instructions only on the boxes and not directly on the medication tubes.   If your medication is too expensive, please contact our office at [phone] option 4 or send us a message through MyChart.   We are unable to tell what your co-pay for medications will be in advance as this is different depending on your insurance coverage. However, we may be able to find a substitute medication at lower cost or fill out paperwork to get insurance to cover a needed medication.   If a prior authorization is required to get your medication covered by your insurance company, please allow us 1-2 business days to complete this process.  Drug prices often vary depending on where the prescription is filled and some pharmacies may offer cheaper prices.  The website www.goodrx.com contains coupons for medications through different pharmacies. The prices here do not account for what the cost may be with help from insurance (it may be cheaper with your insurance), but the website can give you the price if you did not use any insurance.  - You can print the associated coupon and take it with   your prescription to the pharmacy.  - You may also stop by our office during regular business hours and pick up a GoodRx coupon card.  - If you need your prescription sent electronically to a different pharmacy, notify our office through [facility] MyChart or by phone at [phone] option 4.     Si Usted Necesita Algo Despus de Su Visita  Tambin puede enviarnos un mensaje a travs de MyChart. Por lo general respondemos a los mensajes de MyChart en el transcurso de 1 a 2  das hbiles.  Para renovar recetas, por favor pida a su farmacia que se ponga en contacto con nuestra oficina. Nuestro nmero de fax es el [phone].  Si tiene un asunto urgente cuando la clnica est cerrada y que no puede esperar hasta el siguiente da hbil, puede llamar/localizar a su doctor(a) al nmero que aparece a continuacin.   Por favor, tenga en cuenta que aunque hacemos todo lo posible para estar disponibles para asuntos urgentes fuera del horario de oficina, no estamos disponibles las 24 horas del da, los 7 das de la semana.   Si tiene un problema urgente y no puede comunicarse con nosotros, puede optar por buscar atencin mdica  en el consultorio de su doctor(a), en una clnica privada, en un centro de atencin urgente o en una sala de emergencias.  Si tiene una emergencia mdica, por favor llame inmediatamente al 911 o vaya a la sala de emergencias.  Nmeros de bper  - Dr. Kowalski: [phone]  - Dra. Moye: [phone]  - Dra. Stewart: [phone]  En caso de inclemencias del tiempo, por favor llame a nuestra lnea principal al [phone] para una actualizacin sobre el estado de cualquier retraso o cierre.  Consejos para la medicacin en dermatologa: Por favor, guarde las cajas en las que vienen los medicamentos de uso tpico para ayudarle a seguir las instrucciones sobre dnde y cmo usarlos. Las farmacias generalmente imprimen las instrucciones del medicamento slo en las cajas y no directamente en los tubos del medicamento.   Si su medicamento es muy caro, por favor, pngase en contacto con nuestra oficina llamando al [phone] y presione la opcin 4 o envenos un mensaje a travs de MyChart.   No podemos decirle cul ser su copago por los medicamentos por adelantado ya que esto es diferente dependiendo de la cobertura de su seguro. Sin embargo, es posible que podamos encontrar un medicamento sustituto a menor costo o llenar un formulario para que el  seguro cubra el medicamento que se considera necesario.   Si se requiere una autorizacin previa para que su compaa de seguros cubra su medicamento, por favor permtanos de 1 a 2 das hbiles para completar este proceso.  Los precios de los medicamentos varan con frecuencia dependiendo del lugar de dnde se surte la receta y alguna farmacias pueden ofrecer precios ms baratos.  El sitio web www.goodrx.com tiene cupones para medicamentos de diferentes farmacias. Los precios aqu no tienen en cuenta lo que podra costar con la ayuda del seguro (puede ser ms barato con su seguro), pero el sitio web puede darle el precio si no utiliz ningn seguro.  - Puede imprimir el cupn correspondiente y llevarlo con su receta a la farmacia.  - Tambin puede pasar por nuestra oficina durante el horario de atencin regular y recoger una tarjeta de cupones de GoodRx.  - Si necesita que su receta se enve electrnicamente a una farmacia diferente, informe a nuestra oficina a travs de MyChart de [facility]   o por telfono llamando al [phone] y presione la opcin 4.  

## 2022-07-11 ENCOUNTER — Encounter: Payer: Self-pay | Admitting: Dermatology

## 2022-10-15 ENCOUNTER — Ambulatory Visit (INDEPENDENT_AMBULATORY_CARE_PROVIDER_SITE_OTHER): Payer: Self-pay | Admitting: Dermatology

## 2022-10-15 VITALS — BP 118/74 | HR 70

## 2022-10-15 DIAGNOSIS — L988 Other specified disorders of the skin and subcutaneous tissue: Secondary | ICD-10-CM

## 2022-10-15 NOTE — Progress Notes (Signed)
   Follow-Up Visit   Subjective  Tonya Gregory is a 62 y.o. female who presents for the following: Facial Elastosis (Face, pt presents for botox, pt interested in Botox to [facility]). After further discussion, she is actually interested in Botox for the masseter muscles.  Apparently she is grinding her teeth a lot and her daughter is a Pharmacist, community and recommended she get Botox injection to the masseters to decrease the strength and muscle to decrease her grinding teeth.  The following portions of the chart were reviewed this encounter and updated as appropriate:   Tobacco  Allergies  Meds  Problems  Med Hx  Surg Hx  Fam Hx     Review of Systems:  No other skin or systemic complaints except as noted in HPI or Assessment and Plan.  Objective  Well appearing patient in no apparent distress; mood and affect are within normal limits.  A focused examination was performed including face. Relevant physical exam findings are noted in the Assessment and Plan.  face Rhytides and volume loss.       Assessment & Plan  Elastosis of skin face Botox 45 units injected as marked: - Frown complex 25 units - B/L brow lift 5 units x 2 - Masseter 5 units x 2 (discussed can add more in the future if needed)  Recommend filler to oral commissures  Botox Injection - face Location: frown complex, brow lift bil, masseter bil  Informed consent: Discussed risks (infection, pain, bleeding, bruising, swelling, allergic reaction, paralysis of nearby muscles, eyelid droop, double vision, neck weakness, difficulty breathing, headache, undesirable cosmetic result, and need for additional treatment) and benefits of the procedure, as well as the alternatives.  Informed consent was obtained.  Preparation: The area was cleansed with alcohol.  Procedure Details:  Botox was injected into the dermis with a 30-gauge needle. Pressure applied to any bleeding. Ice packs offered for swelling.  Lot Number:   [id number] Expiration:  10/2024  Total Units Injected:  45  Plan: Patient was instructed to remain upright for 4 hours. Patient was instructed to avoid massaging the face and avoid vigorous exercise for the rest of the day. Tylenol may be used for headache.  Allow 2 weeks before returning to clinic for additional dosing as needed. Patient will call for any problems.  Return for 3-58m Botox, filler.  I, Othelia Pulling, RMA, am acting as scribe for Sarina Ser, MD . Documentation: I have reviewed the above documentation for accuracy and completeness, and I agree with the above.  Sarina Ser, MD

## 2022-10-15 NOTE — Patient Instructions (Signed)
Due to recent changes in healthcare laws, you may see results of your pathology and/or laboratory studies on MyChart before the doctors have had a chance to review them. We understand that in some cases there may be results that are confusing or concerning to you. Please understand that not all results are received at the same time and often the doctors may need to interpret multiple results in order to provide you with the best plan of care or course of treatment. Therefore, we ask that you please give us 2 business days to thoroughly review all your results before contacting the office for clarification. Should we see a critical lab result, you will be contacted sooner.   If You Need Anything After Your Visit  If you have any questions or concerns for your doctor, please call our main line at [phone] and press option 4 to reach your doctor's medical assistant. If no one answers, please leave a voicemail as directed and we will return your call as soon as possible. Messages left after 4 pm will be answered the following business day.   You may also send us a message via MyChart. We typically respond to MyChart messages within 1-2 business days.  For prescription refills, please ask your pharmacy to contact our office. Our fax number is [phone].  If you have an urgent issue when the clinic is closed that cannot wait until the next business day, you can page your doctor at the number below.    Please note that while we do our best to be available for urgent issues outside of office hours, we are not available 24/7.   If you have an urgent issue and are unable to reach us, you may choose to seek medical care at your doctor's office, retail clinic, urgent care center, or emergency room.  If you have a medical emergency, please immediately call 911 or go to the emergency department.  Pager Numbers  - Dr. Kowalski: [phone]  - Dr. Moye: [phone]  - Dr. Stewart:  [phone]  In the event of inclement weather, please call our main line at [phone] for an update on the status of any delays or closures.  Dermatology Medication Tips: Please keep the boxes that topical medications come in in order to help keep track of the instructions about where and how to use these. Pharmacies typically print the medication instructions only on the boxes and not directly on the medication tubes.   If your medication is too expensive, please contact our office at [phone] option 4 or send us a message through MyChart.   We are unable to tell what your co-pay for medications will be in advance as this is different depending on your insurance coverage. However, we may be able to find a substitute medication at lower cost or fill out paperwork to get insurance to cover a needed medication.   If a prior authorization is required to get your medication covered by your insurance company, please allow us 1-2 business days to complete this process.  Drug prices often vary depending on where the prescription is filled and some pharmacies may offer cheaper prices.  The website www.goodrx.com contains coupons for medications through different pharmacies. The prices here do not account for what the cost may be with help from insurance (it may be cheaper with your insurance), but the website can give you the price if you did not use any insurance.  - You can print the associated coupon and take it with   your prescription to the pharmacy.  - You may also stop by our office during regular business hours and pick up a GoodRx coupon card.  - If you need your prescription sent electronically to a different pharmacy, notify our office through [facility] MyChart or by phone at [phone] option 4.     Si Usted Necesita Algo Despus de Su Visita  Tambin puede enviarnos un mensaje a travs de MyChart. Por lo general respondemos a los mensajes de MyChart en el transcurso de 1 a 2  das hbiles.  Para renovar recetas, por favor pida a su farmacia que se ponga en contacto con nuestra oficina. Nuestro nmero de fax es el [phone].  Si tiene un asunto urgente cuando la clnica est cerrada y que no puede esperar hasta el siguiente da hbil, puede llamar/localizar a su doctor(a) al nmero que aparece a continuacin.   Por favor, tenga en cuenta que aunque hacemos todo lo posible para estar disponibles para asuntos urgentes fuera del horario de oficina, no estamos disponibles las 24 horas del da, los 7 das de la semana.   Si tiene un problema urgente y no puede comunicarse con nosotros, puede optar por buscar atencin mdica  en el consultorio de su doctor(a), en una clnica privada, en un centro de atencin urgente o en una sala de emergencias.  Si tiene una emergencia mdica, por favor llame inmediatamente al 911 o vaya a la sala de emergencias.  Nmeros de bper  - Dr. Kowalski: [phone]  - Dra. Moye: [phone]  - Dra. Stewart: [phone]  En caso de inclemencias del tiempo, por favor llame a nuestra lnea principal al [phone] para una actualizacin sobre el estado de cualquier retraso o cierre.  Consejos para la medicacin en dermatologa: Por favor, guarde las cajas en las que vienen los medicamentos de uso tpico para ayudarle a seguir las instrucciones sobre dnde y cmo usarlos. Las farmacias generalmente imprimen las instrucciones del medicamento slo en las cajas y no directamente en los tubos del medicamento.   Si su medicamento es muy caro, por favor, pngase en contacto con nuestra oficina llamando al [phone] y presione la opcin 4 o envenos un mensaje a travs de MyChart.   No podemos decirle cul ser su copago por los medicamentos por adelantado ya que esto es diferente dependiendo de la cobertura de su seguro. Sin embargo, es posible que podamos encontrar un medicamento sustituto a menor costo o llenar un formulario para que el  seguro cubra el medicamento que se considera necesario.   Si se requiere una autorizacin previa para que su compaa de seguros cubra su medicamento, por favor permtanos de 1 a 2 das hbiles para completar este proceso.  Los precios de los medicamentos varan con frecuencia dependiendo del lugar de dnde se surte la receta y alguna farmacias pueden ofrecer precios ms baratos.  El sitio web www.goodrx.com tiene cupones para medicamentos de diferentes farmacias. Los precios aqu no tienen en cuenta lo que podra costar con la ayuda del seguro (puede ser ms barato con su seguro), pero el sitio web puede darle el precio si no utiliz ningn seguro.  - Puede imprimir el cupn correspondiente y llevarlo con su receta a la farmacia.  - Tambin puede pasar por nuestra oficina durante el horario de atencin regular y recoger una tarjeta de cupones de GoodRx.  - Si necesita que su receta se enve electrnicamente a una farmacia diferente, informe a nuestra oficina a travs de MyChart de [facility]   o por telfono llamando al [phone] y presione la opcin 4.  

## 2022-10-17 ENCOUNTER — Encounter: Payer: Self-pay | Admitting: Dermatology

## 2022-10-21 ENCOUNTER — Ambulatory Visit: Payer: Self-pay | Admitting: Dermatology

## 2022-11-11 ENCOUNTER — Telehealth: Payer: Self-pay | Admitting: Internal Medicine

## 2022-11-11 DIAGNOSIS — E785 Hyperlipidemia, unspecified: Secondary | ICD-10-CM

## 2022-11-11 DIAGNOSIS — I10 Essential (primary) hypertension: Secondary | ICD-10-CM

## 2022-11-11 DIAGNOSIS — R5383 Other fatigue: Secondary | ICD-10-CM

## 2022-11-11 DIAGNOSIS — R7301 Impaired fasting glucose: Secondary | ICD-10-CM

## 2022-11-11 NOTE — Telephone Encounter (Signed)
Pt stopped by today at front desk asking for an appt with provider and also blood work before. I booked her for blood work for 5/10, however, orders need to be in. Pt will see provider on 5/15.

## 2022-11-12 NOTE — Telephone Encounter (Signed)
Orders have been placed for lab appt.  

## 2022-11-17 ENCOUNTER — Other Ambulatory Visit: Payer: Self-pay | Admitting: Internal Medicine

## 2022-11-17 MED ORDER — LISINOPRIL 10 MG PO TABS: 10.0000 mg | ORAL_TABLET | ORAL | 0 refills | Status: DC

## 2022-12-12 ENCOUNTER — Other Ambulatory Visit: Payer: Self-pay

## 2022-12-14 ENCOUNTER — Other Ambulatory Visit: Payer: Self-pay | Admitting: Internal Medicine

## 2022-12-15 ENCOUNTER — Other Ambulatory Visit (INDEPENDENT_AMBULATORY_CARE_PROVIDER_SITE_OTHER): Payer: Self-pay

## 2022-12-15 DIAGNOSIS — I10 Essential (primary) hypertension: Secondary | ICD-10-CM

## 2022-12-15 DIAGNOSIS — E785 Hyperlipidemia, unspecified: Secondary | ICD-10-CM

## 2022-12-15 DIAGNOSIS — R5383 Other fatigue: Secondary | ICD-10-CM

## 2022-12-15 DIAGNOSIS — R7301 Impaired fasting glucose: Secondary | ICD-10-CM

## 2022-12-15 LAB — CBC WITH DIFFERENTIAL/PLATELET
Basophils Absolute: 0 10*3/uL (ref 0.0–0.1)
Basophils Relative: 0.5 % (ref 0.0–3.0)
Eosinophils Absolute: 0.1 10*3/uL (ref 0.0–0.7)
Eosinophils Relative: 2 % (ref 0.0–5.0)
HCT: 42.1 % (ref 36.0–46.0)
Hemoglobin: 14.5 g/dL (ref 12.0–15.0)
Lymphocytes Relative: 39.4 % (ref 12.0–46.0)
Lymphs Abs: 1.9 10*3/uL (ref 0.7–4.0)
MCHC: 34.3 g/dL (ref 30.0–36.0)
MCV: 93.8 fl (ref 78.0–100.0)
Monocytes Absolute: 0.4 10*3/uL (ref 0.1–1.0)
Monocytes Relative: 7.8 % (ref 3.0–12.0)
Neutro Abs: 2.4 10*3/uL (ref 1.4–7.7)
Neutrophils Relative %: 50.3 % (ref 43.0–77.0)
Platelets: 174 10*3/uL (ref 150.0–400.0)
RBC: 4.49 Mil/uL (ref 3.87–5.11)
RDW: 12.6 % (ref 11.5–15.5)
WBC: 4.8 10*3/uL (ref 4.0–10.5)

## 2022-12-15 LAB — TSH: TSH: 1.68 u[IU]/mL (ref 0.35–5.50)

## 2022-12-15 LAB — COMPREHENSIVE METABOLIC PANEL
ALT: 26 U/L (ref 0–35)
AST: 17 U/L (ref 0–37)
Albumin: 4.2 g/dL (ref 3.5–5.2)
Alkaline Phosphatase: 49 U/L (ref 39–117)
BUN: 17 mg/dL (ref 6–23)
CO2: 26 mEq/L (ref 19–32)
Calcium: 9.3 mg/dL (ref 8.4–10.5)
Chloride: 104 mEq/L (ref 96–112)
Creatinine, Ser: 0.91 mg/dL (ref 0.40–1.20)
GFR: 67.51 mL/min (ref >60.00)
Glucose, Bld: 114 mg/dL — ABNORMAL HIGH (ref 70–99)
Potassium: 4.5 mEq/L (ref 3.5–5.1)
Sodium: 139 mEq/L (ref 135–145)
Total Bilirubin: 0.8 mg/dL (ref 0.2–1.2)
Total Protein: 6.8 g/dL (ref 6.0–8.3)

## 2022-12-15 LAB — HEMOGLOBIN A1C: Hgb A1c MFr Bld: 5.5 % (ref 4.6–6.5)

## 2022-12-15 LAB — LIPID PANEL
Cholesterol: 197 mg/dL (ref 0–200)
HDL: 40.8 mg/dL (ref >39.00)
LDL Cholesterol: 120 mg/dL — ABNORMAL HIGH (ref 0–99)
NonHDL: 155.97
Total CHOL/HDL Ratio: 5
Triglycerides: 179 mg/dL — ABNORMAL HIGH (ref 0.0–149.0)
VLDL: 35.8 mg/dL (ref 0.0–40.0)

## 2022-12-15 LAB — MICROALBUMIN / CREATININE URINE RATIO
Creatinine,U: 29.7 mg/dL
Microalb Creat Ratio: 2.4 mg/g (ref 0.0–30.0)
Microalb, Ur: <0.7 mg/dL (ref 0.0–1.9)

## 2022-12-15 LAB — LDL CHOLESTEROL, DIRECT: Direct LDL: 140 mg/dL

## 2022-12-17 ENCOUNTER — Ambulatory Visit (INDEPENDENT_AMBULATORY_CARE_PROVIDER_SITE_OTHER): Payer: Self-pay | Admitting: Internal Medicine

## 2022-12-17 ENCOUNTER — Encounter: Payer: Self-pay | Admitting: Internal Medicine

## 2022-12-17 VITALS — BP 108/62 | HR 75 | Temp 97.9°F | Ht 68.25 in | Wt 183.4 lb

## 2022-12-17 DIAGNOSIS — Z0001 Encounter for general adult medical examination with abnormal findings: Secondary | ICD-10-CM

## 2022-12-17 DIAGNOSIS — K7689 Other specified diseases of liver: Secondary | ICD-10-CM

## 2022-12-17 DIAGNOSIS — Z0000 Encounter for general adult medical examination without abnormal findings: Secondary | ICD-10-CM

## 2022-12-17 DIAGNOSIS — I10 Essential (primary) hypertension: Secondary | ICD-10-CM

## 2022-12-17 DIAGNOSIS — Z1211 Encounter for screening for malignant neoplasm of colon: Secondary | ICD-10-CM

## 2022-12-17 DIAGNOSIS — Z23 Encounter for immunization: Secondary | ICD-10-CM

## 2022-12-17 DIAGNOSIS — Z13220 Encounter for screening for lipoid disorders: Secondary | ICD-10-CM

## 2022-12-17 DIAGNOSIS — K760 Fatty (change of) liver, not elsewhere classified: Secondary | ICD-10-CM

## 2022-12-17 DIAGNOSIS — Z1231 Encounter for screening mammogram for malignant neoplasm of breast: Secondary | ICD-10-CM

## 2022-12-17 MED ORDER — ATORVASTATIN CALCIUM 10 MG PO TABS: 10.0000 mg | ORAL_TABLET | Freq: Every day | ORAL | 3 refills | Status: DC

## 2022-12-17 MED ORDER — LISINOPRIL 10 MG PO TABS: 10.0000 mg | ORAL_TABLET | Freq: Every morning | ORAL | 1 refills | Status: DC

## 2022-12-17 NOTE — Assessment & Plan Note (Signed)
10 yr risk of CAD using the AHA risk calculator is <5%  .  However she has a history of fatty liver and would benefit from statin trial .   Lab Results  Component Value Date   CHOL 197 12/15/2022   HDL 40.80 12/15/2022   LDLCALC 120 (H) 12/15/2022   LDLDIRECT 140.0 12/15/2022   TRIG 179.0 (H) 12/15/2022   CHOLHDL 5 12/15/2022

## 2022-12-17 NOTE — Assessment & Plan Note (Signed)

## 2022-12-17 NOTE — Progress Notes (Unsigned)
Patient ID: Tonya Gregory, female    DOB: 18-Aug-1960  Age: 62 y.o. MRN: [id number]  The patient is here for annual preventive examination and management of other chronic and acute problems.   The risk factors are reflected in the social history.   The roster of all physicians providing medical care to patient - is listed in the Snapshot section of the chart.   Activities of daily living:  The patient is 100% independent in all ADLs: dressing, toileting, feeding as well as independent mobility   Home safety : The patient has smoke detectors in the home. They wear seatbelts.  There are no unsecured firearms at home. There is no violence in the home.    There is no risks for hepatitis, STDs or HIV. There is no   history of blood transfusion. They have no travel history to infectious disease endemic areas of the world.   The patient has seen their dentist in the last six month. They have seen their eye doctor in the last year. The patinet  denies slight hearing difficulty with regard to whispered voices and some television programs.  They have deferred audiologic testing in the last year.  They do not  have excessive sun exposure. Discussed the need for sun protection: hats, long sleeves and use of sunscreen if there is significant sun exposure.    Diet: the importance of a healthy diet is discussed. They do have a healthy diet.   The benefits of regular aerobic exercise were discussed. The patient  exercises  3 to 5 days per week  for  60 minutes.    Depression screen: there are no signs or vegative symptoms of depression- irritability, change in appetite, anhedonia, sadness/tearfullness.   The following portions of the patient's history were reviewed and updated as appropriate: allergies, current medications, past family history, past medical history,  past surgical history, past social history  and problem list.   Visual acuity was not assessed per patient preference since the patient has  regular follow up with an  ophthalmologist. Hearing and body mass index were assessed and reviewed.    During the course of the visit the patient was educated and counseled about appropriate screening and preventive services including : fall prevention , diabetes screening, nutrition counseling, colorectal cancer screening, and recommended immunizations.    Chief Complaint:   1) HRT:  testosterone pellets and progesterone at a [facility] clinic.  QOL improved : skin ,sleep and joint pain.  Review of Symptoms  Patient denies headache, fevers, malaise, unintentional weight loss, skin rash, eye pain, sinus congestion and sinus pain, sore throat, dysphagia,  hemoptysis , cough, dyspnea, wheezing, chest pain, palpitations, orthopnea, edema, abdominal pain, nausea, melena, diarrhea, constipation, flank pain, dysuria, hematuria, urinary  Frequency, nocturia, numbness, tingling, seizures,  Focal weakness, Loss of consciousness,  Tremor, insomnia, depression, anxiety, and suicidal ideation.    Physical Exam:  BP 108/62   Pulse 75   Temp 97.9 F (36.6 C) (Oral)   Ht 5' 8.25" (1.734 m)   Wt 183 lb 6.4 oz (83.2 kg)   LMP 11/15/2014   SpO2 97%   BMI 27.68 kg/m    Physical Exam Vitals reviewed.  Constitutional:      General: She is not in acute distress.    Appearance: Normal appearance. She is normal weight. She is not ill-appearing, toxic-appearing or diaphoretic.  HENT:     Head: Normocephalic.  Eyes:     General: No scleral icterus.  Right eye: No discharge.        Left eye: No discharge.     Conjunctiva/sclera: Conjunctivae normal.  Cardiovascular:     Rate and Rhythm: Normal rate and regular rhythm.     Heart sounds: Normal heart sounds.  Pulmonary:     Effort: Pulmonary effort is normal. No respiratory distress.     Breath sounds: Normal breath sounds.  Musculoskeletal:        General: Normal range of motion.  Skin:    General: Skin is warm and dry.  Neurological:      General: No focal deficit present.     Mental Status: She is alert and oriented to person, place, and time. Mental status is at baseline.  Psychiatric:        Mood and Affect: Mood normal.        Behavior: Behavior normal.        Thought Content: Thought content normal.        Judgment: Judgment normal.   Assessment and Plan: Colon cancer screening -     Ambulatory referral to Gastroenterology  Encounter for preventive health examination Assessment & Plan: age appropriate education and counseling updated, referrals for preventative services and immunizations addressed, dietary and smoking counseling addressed, most recent labs reviewed.  I have personally reviewed and have noted:   1) the patient's medical and social history 2) The pt's use of alcohol, tobacco, and illicit drugs 3) The patient's current medications and supplements 4) Functional ability including ADL's, fall risk, home safety risk, hearing and visual impairment 5) Diet and physical activities 6) Evidence for depression or mood disorder 7) The patient's height, weight, and BMI have been recorded in the chart    I have made referrals, and provided counseling and education based on review of the above    Encounter for screening mammogram for malignant neoplasm of breast -     3D Screening Mammogram, Left and Right; Future  Screening for lipoid disorders Assessment & Plan: 10 yr risk of CAD using the AHA risk calculator is <5%  .  However she has a history of fatty liver and would benefit from statin trial .   Lab Results  Component Value Date   CHOL 197 12/15/2022   HDL 40.80 12/15/2022   LDLCALC 120 (H) 12/15/2022   LDLDIRECT 140.0 12/15/2022   TRIG 179.0 (H) 12/15/2022   CHOLHDL 5 12/15/2022      Other orders -     Lisinopril; Take 1 tablet (10 mg total) by mouth every morning.  Dispense: 90 tablet; Refill: 1    No follow-ups on file.  Sherlene Shams, MD

## 2022-12-17 NOTE — Patient Instructions (Addendum)
You can Reduce lisinopril to 1/2 tablet and follow BP .  Goal 120/70  to 130/80   I recommend a trial of generic Lipitor to help manage your fatty liver  Please return in 3 weeks for A LAB VISIT   to monitor liver enzymes   Annual ultrasound is recommended to monitor fatty liver and has been ordered

## 2022-12-18 ENCOUNTER — Telehealth: Payer: Self-pay | Admitting: Internal Medicine

## 2022-12-18 MED ORDER — LISINOPRIL 5 MG PO TABS: 5.0000 mg | ORAL_TABLET | Freq: Every day | ORAL | 0 refills | Status: DC

## 2022-12-18 NOTE — Telephone Encounter (Signed)
Pharmacy called stating the pt stated the medication was to be changed to five mg instead of ten mg

## 2022-12-18 NOTE — Telephone Encounter (Signed)
Spoke with pt and she stated that the Lisinopril 10 mg tablet is to small to cut in half and asked to have a rx sent in for the 5 mg dose. I have sent in the new rx for the 5 mg dose per the AVS from yesterday.

## 2022-12-18 NOTE — Assessment & Plan Note (Signed)
Large,  11 cm.  Repeat u/s ordered

## 2022-12-18 NOTE — Assessment & Plan Note (Signed)
Improved,  reduce lisinopril to 5 mg daily

## 2022-12-18 NOTE — Assessment & Plan Note (Signed)
Previous visit with Der Ardine Eng reviewed.   Now willing to start statin .  Repeat liver enzymes needed In a few weeks.

## 2022-12-19 NOTE — Addendum Note (Signed)
Addended by: Sherlene Shams on: 12/19/2022 08:45 AM   Modules accepted: Orders

## 2023-01-08 ENCOUNTER — Other Ambulatory Visit (INDEPENDENT_AMBULATORY_CARE_PROVIDER_SITE_OTHER): Payer: Self-pay

## 2023-01-08 DIAGNOSIS — K760 Fatty (change of) liver, not elsewhere classified: Secondary | ICD-10-CM

## 2023-01-08 LAB — COMPREHENSIVE METABOLIC PANEL
ALT: 28 U/L (ref 0–35)
AST: 18 U/L (ref 0–37)
Albumin: 4.5 g/dL (ref 3.5–5.2)
Alkaline Phosphatase: 48 U/L (ref 39–117)
BUN: 19 mg/dL (ref 6–23)
CO2: 27 mEq/L (ref 19–32)
Calcium: 9.2 mg/dL (ref 8.4–10.5)
Chloride: 103 mEq/L (ref 96–112)
Creatinine, Ser: 0.98 mg/dL (ref 0.40–1.20)
GFR: 61.74 mL/min (ref >60.00)
Glucose, Bld: 108 mg/dL — ABNORMAL HIGH (ref 70–99)
Potassium: 4.6 mEq/L (ref 3.5–5.1)
Sodium: 138 mEq/L (ref 135–145)
Total Bilirubin: 0.8 mg/dL (ref 0.2–1.2)
Total Protein: 7.4 g/dL (ref 6.0–8.3)

## 2023-01-08 LAB — CK: Total CK: 80 U/L (ref 7–177)

## 2023-01-12 ENCOUNTER — Ambulatory Visit
Admission: RE | Admit: 2023-01-12 | Discharge: 2023-01-12 | Disposition: A | Discharge status: Home / Self Care | Payer: Self-pay | Source: Ambulatory Visit | Attending: Internal Medicine | Admitting: Internal Medicine

## 2023-01-12 DIAGNOSIS — K7689 Other specified diseases of liver: Secondary | ICD-10-CM

## 2023-01-12 DIAGNOSIS — K760 Fatty (change of) liver, not elsewhere classified: Secondary | ICD-10-CM

## 2023-01-15 ENCOUNTER — Ambulatory Visit: Payer: Self-pay | Admitting: Dermatology

## 2023-01-28 ENCOUNTER — Encounter: Payer: Self-pay | Admitting: Dermatology

## 2023-01-28 ENCOUNTER — Ambulatory Visit (INDEPENDENT_AMBULATORY_CARE_PROVIDER_SITE_OTHER): Payer: Self-pay | Admitting: Dermatology

## 2023-01-28 DIAGNOSIS — L988 Other specified disorders of the skin and subcutaneous tissue: Secondary | ICD-10-CM

## 2023-01-28 NOTE — Progress Notes (Signed)
   Follow-Up Visit   Subjective  Tonya Gregory is a 62 y.o. female who presents for the following: filler for facial elastosis  The following portions of the chart were reviewed this encounter and updated as appropriate: medications, allergies, medical history  Review of Systems:  No other skin or systemic complaints except as noted in HPI or Assessment and Plan.  Objective  Well appearing patient in no apparent distress; mood and affect are within normal limits.  A focused examination was performed of the face. Relevant physical exam findings are noted in the Assessment and Plan or shown in photos.  Before photos            Injection map photo     Assessment & Plan    Facial Elastosis  Prior to the procedure, the patient's past medical history, allergies and the rare but potential risks and complications were reviewed with the patient and a signed consent was obtained. Pre and post-treatment care was discussed and instructions provided.   Location: lips and chin, oral commissures, mid face  Filler Type: Restylane refyne and Juvederm Voluma  Procedure: Lidocaine-tetracaine ointment was applied to treatment areas to achieve good local anesthesia. The area was prepped thoroughly with Puracyn. After introducing the needle into the desired treatment area, the syringe plunger was drawn back to ensure there was no flash of blood prior to injecting the filler in order to minimize risk of intravascular injection and vascular occlusion. After injection of the filler, the treated areas were cleansed and iced to reduce swelling. Post-treatment instructions were reviewed with the patient.       Patient tolerated the procedure well. The patient will call with any problems, questions or concerns prior to their next appointment.  Restylane Refyne: oral commissures; marionette lines; upper and lower lip vermillion; anterior chin crease. Lot: [id number] Exp: 11/02/2023  Juvederm  Voluma:  Mid face Lot: [id number] Exp: 09/17/2023  Facial Elastosis  Location: See attached image  Informed consent: Discussed risks (infection, pain, bleeding, bruising, swelling, allergic reaction, paralysis of nearby muscles, eyelid droop, double vision, neck weakness, difficulty breathing, headache, undesirable cosmetic result, and need for additional treatment) and benefits of the procedure, as well as the alternatives.  Informed consent was obtained.  Preparation: The area was cleansed with alcohol.  Procedure Details:  Botox was injected into the dermis with a 30-gauge needle. Pressure applied to any bleeding. Ice packs offered for swelling.  Lot Number:  [id number] Expiration:  01/2025  Total Units Injected:  45  Plan: Tylenol may be used for headache.  Allow 2 weeks before returning to clinic for additional dosing as needed. Patient will call for any problems.    Return in about 3 months (around 04/30/2023) for Botox.  I, Lawson Radar, CMA, am acting as scribe for Armida Sans, MD.  Documentation: I have reviewed the above documentation for accuracy and completeness, and I agree with the above.  Armida Sans, MD

## 2023-01-28 NOTE — Patient Instructions (Signed)
Due to recent changes in healthcare laws, you may see results of your pathology and/or laboratory studies on MyChart before the doctors have had a chance to review them. We understand that in some cases there may be results that are confusing or concerning to you. Please understand that not all results are received at the same time and often the doctors may need to interpret multiple results in order to provide you with the best plan of care or course of treatment. Therefore, we ask that you please give us 2 business days to thoroughly review all your results before contacting the office for clarification. Should we see a critical lab result, you will be contacted sooner.   If You Need Anything After Your Visit  If you have any questions or concerns for your doctor, please call our main line at [phone] and press option 4 to reach your doctor's medical assistant. If no one answers, please leave a voicemail as directed and we will return your call as soon as possible. Messages left after 4 pm will be answered the following business day.   You may also send us a message via MyChart. We typically respond to MyChart messages within 1-2 business days.  For prescription refills, please ask your pharmacy to contact our office. Our fax number is [phone].  If you have an urgent issue when the clinic is closed that cannot wait until the next business day, you can page your doctor at the number below.    Please note that while we do our best to be available for urgent issues outside of office hours, we are not available 24/7.   If you have an urgent issue and are unable to reach us, you may choose to seek medical care at your doctor's office, retail clinic, urgent care center, or emergency room.  If you have a medical emergency, please immediately call 911 or go to the emergency department.  Pager Numbers  - Dr. Kowalski: [phone]  - Dr. Moye: [phone]  - Dr. Stewart:  [phone]  In the event of inclement weather, please call our main line at [phone] for an update on the status of any delays or closures.  Dermatology Medication Tips: Please keep the boxes that topical medications come in in order to help keep track of the instructions about where and how to use these. Pharmacies typically print the medication instructions only on the boxes and not directly on the medication tubes.   If your medication is too expensive, please contact our office at [phone] option 4 or send us a message through MyChart.   We are unable to tell what your co-pay for medications will be in advance as this is different depending on your insurance coverage. However, we may be able to find a substitute medication at lower cost or fill out paperwork to get insurance to cover a needed medication.   If a prior authorization is required to get your medication covered by your insurance company, please allow us 1-2 business days to complete this process.  Drug prices often vary depending on where the prescription is filled and some pharmacies may offer cheaper prices.  The website www.goodrx.com contains coupons for medications through different pharmacies. The prices here do not account for what the cost may be with help from insurance (it may be cheaper with your insurance), but the website can give you the price if you did not use any insurance.  - You can print the associated coupon and take it with   your prescription to the pharmacy.  - You may also stop by our office during regular business hours and pick up a GoodRx coupon card.  - If you need your prescription sent electronically to a different pharmacy, notify our office through [facility] MyChart or by phone at [phone] option 4.     Si Usted Necesita Algo Despus de Su Visita  Tambin puede enviarnos un mensaje a travs de MyChart. Por lo general respondemos a los mensajes de MyChart en el transcurso de 1 a 2  das hbiles.  Para renovar recetas, por favor pida a su farmacia que se ponga en contacto con nuestra oficina. Nuestro nmero de fax es el [phone].  Si tiene un asunto urgente cuando la clnica est cerrada y que no puede esperar hasta el siguiente da hbil, puede llamar/localizar a su doctor(a) al nmero que aparece a continuacin.   Por favor, tenga en cuenta que aunque hacemos todo lo posible para estar disponibles para asuntos urgentes fuera del horario de oficina, no estamos disponibles las 24 horas del da, los 7 das de la semana.   Si tiene un problema urgente y no puede comunicarse con nosotros, puede optar por buscar atencin mdica  en el consultorio de su doctor(a), en una clnica privada, en un centro de atencin urgente o en una sala de emergencias.  Si tiene una emergencia mdica, por favor llame inmediatamente al 911 o vaya a la sala de emergencias.  Nmeros de bper  - Dr. Kowalski: [phone]  - Dra. Moye: [phone]  - Dra. Stewart: [phone]  En caso de inclemencias del tiempo, por favor llame a nuestra lnea principal al [phone] para una actualizacin sobre el estado de cualquier retraso o cierre.  Consejos para la medicacin en dermatologa: Por favor, guarde las cajas en las que vienen los medicamentos de uso tpico para ayudarle a seguir las instrucciones sobre dnde y cmo usarlos. Las farmacias generalmente imprimen las instrucciones del medicamento slo en las cajas y no directamente en los tubos del medicamento.   Si su medicamento es muy caro, por favor, pngase en contacto con nuestra oficina llamando al [phone] y presione la opcin 4 o envenos un mensaje a travs de MyChart.   No podemos decirle cul ser su copago por los medicamentos por adelantado ya que esto es diferente dependiendo de la cobertura de su seguro. Sin embargo, es posible que podamos encontrar un medicamento sustituto a menor costo o llenar un formulario para que el  seguro cubra el medicamento que se considera necesario.   Si se requiere una autorizacin previa para que su compaa de seguros cubra su medicamento, por favor permtanos de 1 a 2 das hbiles para completar este proceso.  Los precios de los medicamentos varan con frecuencia dependiendo del lugar de dnde se surte la receta y alguna farmacias pueden ofrecer precios ms baratos.  El sitio web www.goodrx.com tiene cupones para medicamentos de diferentes farmacias. Los precios aqu no tienen en cuenta lo que podra costar con la ayuda del seguro (puede ser ms barato con su seguro), pero el sitio web puede darle el precio si no utiliz ningn seguro.  - Puede imprimir el cupn correspondiente y llevarlo con su receta a la farmacia.  - Tambin puede pasar por nuestra oficina durante el horario de atencin regular y recoger una tarjeta de cupones de GoodRx.  - Si necesita que su receta se enve electrnicamente a una farmacia diferente, informe a nuestra oficina a travs de MyChart de [place]   o por telfono llamando al [phone] y presione la opcin 4.  

## 2023-01-30 ENCOUNTER — Encounter: Payer: Self-pay | Admitting: Dermatology

## 2023-03-19 ENCOUNTER — Ambulatory Visit: Payer: Self-pay | Admitting: Dermatology

## 2023-03-30 ENCOUNTER — Ambulatory Visit
Admission: RE | Admit: 2023-03-30 | Discharge: 2023-03-30 | Disposition: A | Discharge status: Home / Self Care | Payer: No Typology Code available for payment source | Source: Ambulatory Visit | Attending: Internal Medicine | Admitting: Internal Medicine

## 2023-03-30 DIAGNOSIS — Z1231 Encounter for screening mammogram for malignant neoplasm of breast: Secondary | ICD-10-CM

## 2023-04-03 ENCOUNTER — Other Ambulatory Visit: Payer: Self-pay | Admitting: Internal Medicine

## 2023-04-08 ENCOUNTER — Ambulatory Visit: Payer: Self-pay | Admitting: Dermatology

## 2023-04-14 ENCOUNTER — Ambulatory Visit (INDEPENDENT_AMBULATORY_CARE_PROVIDER_SITE_OTHER): Payer: Self-pay | Admitting: Dermatology

## 2023-04-14 ENCOUNTER — Other Ambulatory Visit: Payer: Self-pay

## 2023-04-14 ENCOUNTER — Encounter: Payer: Self-pay | Admitting: Dermatology

## 2023-04-14 DIAGNOSIS — L988 Other specified disorders of the skin and subcutaneous tissue: Secondary | ICD-10-CM

## 2023-04-14 MED ORDER — KETOCONAZOLE 2 % EX SHAM
MEDICATED_SHAMPOO | CUTANEOUS | 5 refills | Status: AC
Start: 2023-04-14 — End: ?

## 2023-04-14 NOTE — Progress Notes (Signed)
   Follow-Up Visit   Subjective  Tonya Gregory is a 62 y.o. female who presents for the following: Botox for facial elastosis  The following portions of the chart were reviewed this encounter and updated as appropriate: medications, allergies, medical history  Review of Systems:  No other skin or systemic complaints except as noted in HPI or Assessment and Plan.  Objective  Well appearing patient in no apparent distress; mood and affect are within normal limits.  A focused examination was performed of the face.  Relevant physical exam findings are noted in the Assessment and Plan.      Assessment & Plan   Facial Elastosis  Botox 35 units injected as marked: - Frown complex 25 units - Brow lift 5 units each for a total of 10 units  Location: See attached image  Informed consent: Discussed risks (infection, pain, bleeding, bruising, swelling, allergic reaction, paralysis of nearby muscles, eyelid droop, double vision, neck weakness, difficulty breathing, headache, undesirable cosmetic result, and need for additional treatment) and benefits of the procedure, as well as the alternatives.  Informed consent was obtained.  Preparation: The area was cleansed with alcohol.  Procedure Details:  Botox was injected into the dermis with a 30-gauge needle. Pressure applied to any bleeding. Ice packs offered for swelling.  Lot Number:  [id number] Expiration:  04/2025  Total Units Injected:  35  Plan: Tylenol may be used for headache.  Allow 2 weeks before returning to clinic for additional dosing as needed. Patient will call for any problems.  Return for Botox injections.  Maylene Roes, CMA, am acting as scribe for Armida Sans, MD .  Documentation: I have reviewed the above documentation for accuracy and completeness, and I agree with the above.  Armida Sans, MD

## 2023-04-14 NOTE — Progress Notes (Signed)
Patient requested refills.

## 2023-04-14 NOTE — Patient Instructions (Signed)

## 2023-05-14 ENCOUNTER — Telehealth: Payer: Self-pay | Admitting: Internal Medicine

## 2023-05-14 DIAGNOSIS — E785 Hyperlipidemia, unspecified: Secondary | ICD-10-CM

## 2023-05-14 DIAGNOSIS — I10 Essential (primary) hypertension: Secondary | ICD-10-CM

## 2023-05-14 NOTE — Telephone Encounter (Signed)
Patient has a 70m follow up with Dr Darrick Huntsman. She wanted to know if she could do labs before her appoinment.

## 2023-05-15 NOTE — Telephone Encounter (Signed)
I have pended labs for your approval.

## 2023-05-15 NOTE — Addendum Note (Signed)
Addended by: Sherlene Shams on: 05/15/2023 12:34 PM   Modules accepted: Orders

## 2023-05-15 NOTE — Telephone Encounter (Signed)
Spoke with pt and scheduled lab appt

## 2023-05-15 NOTE — Addendum Note (Signed)
Addended by: Sandy Salaam on: 05/15/2023 09:49 AM   Modules accepted: Orders

## 2023-06-17 ENCOUNTER — Other Ambulatory Visit (INDEPENDENT_AMBULATORY_CARE_PROVIDER_SITE_OTHER): Payer: Self-pay

## 2023-06-17 DIAGNOSIS — I10 Essential (primary) hypertension: Secondary | ICD-10-CM

## 2023-06-17 DIAGNOSIS — E785 Hyperlipidemia, unspecified: Secondary | ICD-10-CM

## 2023-06-17 LAB — LIPID PANEL
Cholesterol: 189 mg/dL (ref 0–200)
HDL: 46.2 mg/dL (ref >39.00)
LDL Cholesterol: 127 mg/dL — ABNORMAL HIGH (ref 0–99)
NonHDL: 142.96
Total CHOL/HDL Ratio: 4
Triglycerides: 80 mg/dL (ref 0.0–149.0)
VLDL: 16 mg/dL (ref 0.0–40.0)

## 2023-06-17 LAB — COMPREHENSIVE METABOLIC PANEL
ALT: 22 U/L (ref 0–35)
AST: 16 U/L (ref 0–37)
Albumin: 4.4 g/dL (ref 3.5–5.2)
Alkaline Phosphatase: 53 U/L (ref 39–117)
BUN: 17 mg/dL (ref 6–23)
CO2: 29 meq/L (ref 19–32)
Calcium: 9.8 mg/dL (ref 8.4–10.5)
Chloride: 103 meq/L (ref 96–112)
Creatinine, Ser: 0.91 mg/dL (ref 0.40–1.20)
GFR: 67.27 mL/min (ref >60.00)
Glucose, Bld: 102 mg/dL — ABNORMAL HIGH (ref 70–99)
Potassium: 4.5 meq/L (ref 3.5–5.1)
Sodium: 139 meq/L (ref 135–145)
Total Bilirubin: 0.8 mg/dL (ref 0.2–1.2)
Total Protein: 7.2 g/dL (ref 6.0–8.3)

## 2023-06-17 LAB — LDL CHOLESTEROL, DIRECT: Direct LDL: 143 mg/dL

## 2023-06-18 ENCOUNTER — Telehealth: Payer: Self-pay

## 2023-06-18 NOTE — Telephone Encounter (Signed)
Lvm was just calling to go over meds prior to appt

## 2023-06-19 ENCOUNTER — Ambulatory Visit (INDEPENDENT_AMBULATORY_CARE_PROVIDER_SITE_OTHER): Payer: Self-pay | Admitting: Internal Medicine

## 2023-06-19 ENCOUNTER — Encounter: Payer: Self-pay | Admitting: Internal Medicine

## 2023-06-19 VITALS — BP 110/72 | HR 74 | Temp 97.8°F | Ht 68.0 in | Wt 167.2 lb

## 2023-06-19 DIAGNOSIS — K760 Fatty (change of) liver, not elsewhere classified: Secondary | ICD-10-CM

## 2023-06-19 DIAGNOSIS — Z79890 Hormone replacement therapy (postmenopausal): Secondary | ICD-10-CM

## 2023-06-19 DIAGNOSIS — I10 Essential (primary) hypertension: Secondary | ICD-10-CM

## 2023-06-19 DIAGNOSIS — Z23 Encounter for immunization: Secondary | ICD-10-CM

## 2023-06-19 DIAGNOSIS — Z90710 Acquired absence of both cervix and uterus: Secondary | ICD-10-CM

## 2023-06-19 DIAGNOSIS — I152 Hypertension secondary to endocrine disorders: Secondary | ICD-10-CM

## 2023-06-19 MED ORDER — ALBUTEROL SULFATE HFA 108 (90 BASE) MCG/ACT IN AERS: 1.0000 | INHALATION_SPRAY | Freq: Four times a day (QID) | RESPIRATORY_TRACT | 3 refills | Status: DC | PRN

## 2023-06-19 NOTE — Assessment & Plan Note (Addendum)
She had been referred to Dr Ardine Eng at the [facility] clinic.  We reviewed recommendations.  She will resume statin  based on today's discussion

## 2023-06-19 NOTE — Patient Instructions (Signed)
I recommend restarting the atorvastatin based on Dr Hinton Lovely recommendations (see below) :   1- reducing alcohol consumption to one drink or less per day since the prevalence of alcohol-related liver injury is low at these levels of consumption 2 - follow-up with primary providers to optimize medical management of cholesterol. Statins are not contraindicated if needed to control lipids and may even improve liver histology in NAFLD 3 - other lifestyle modifications known to improve liver fat and prevent liver inflammation and fibrosis: lose 10% body weight if overweight/obese, avoid high fructose beverages, exercise 30 min/d at least 5 d/week, consider mediterranean type diet + drinking 2 cups of coffee/d 4 - Annual liver exams to include physical exam + blood tests (AST, ALT, AP, total bili, albumin, INR, platelets) with referral back to liver clinic if evidence of worsening liver disease evolves

## 2023-06-19 NOTE — Progress Notes (Unsigned)
Subjective:  Patient ID: Tonya Gregory, female    DOB: 12/14/1960  Age: 62 y.o. MRN: [id number]  CC: {There were no encounter diagnoses. (Refresh or delete this SmartLink)}   HPI AZAIRA SHEERIN presents for No chief complaint on file.   1)  HTN  Patient is taking her medications as prescribed and notes no adverse effects.  Home BP readings have been done about once per week and are  generally < 120/70 .  She is avoiding added salt in her diet and walking regularly about 3 times per week for exercise  .   2) HLD:  was prescribed atorvastatin for fatty liver but stopped it due to  misinformation on blogs   willing to restart med    3)  ? Thyroid disease?   No. using it for weight loss prescribed the [facility]   taking Armour   15 mg daily , TSH was normal in May.    prescribed by  [facility]) ng with progesterone and testosterone for HRT    4) anxiety:  husband Weston Brass  has had a return of prostate CA  and is starting salvage therapy at [facility].  His bp has been elevated     Lab Results  Component Value Date   TSH 1.68 12/15/2022     Outpatient Medications Prior to Visit  Medication Sig Dispense Refill   acetaminophen (TYLENOL) 500 MG tablet Take 500 mg by mouth every 6 (six) hours as needed (for pain/headaches.).     albuterol (VENTOLIN HFA) 108 (90 Base) MCG/ACT inhaler INHALE ONE TO TWO PUFFS BY MOUTH EVERY 6 HOURS AS NEEDED FOR WHEEZING OR SHORTNESS OF BREATH 8.5 g 3   atorvastatin (LIPITOR) 10 MG tablet Take 1 tablet (10 mg total) by mouth daily. 90 tablet 3   fluconazole (DIFLUCAN) 150 MG tablet Take 1 tablet (150 mg total) by mouth daily. 2 tablet 0   ibuprofen (ADVIL,MOTRIN) 200 MG tablet Take 400 mg by mouth every 8 (eight) hours as needed (for pain/headaches.).     ketoconazole (NIZORAL) 2 % shampoo Shampoo onto skin let sit 5-10 minutes then wash off. Use 3d/wk for 6 weeks. Then QM thereafter. 120 mL 5   lisinopril (ZESTRIL) 10 MG tablet Take 1 tablet (10 mg  total) by mouth every morning. 90 tablet 1   lisinopril (ZESTRIL) 5 MG tablet TAKE 1 TABLET BY MOUTH DAILY 90 tablet 0   Nutritional Supplements (JUICE PLUS FIBRE PO) Take 6 capsules by mouth daily. Take 2 tablet Juice Plus Fruit, 2 tablets of Vegetable, & 2 tablets of Berry Blend in the morning     Omega-3 Fatty Acids (FISH OIL PO) Take 1 capsule by mouth daily.     PRESCRIPTION MEDICATION PT GETTING TESTOSTERONE PELLETS INJECTED AT [facility] IN [facility] WITH DR Joesphine Bare INJECTED DEC 2018     progesterone (PROMETRIUM) 200 MG capsule Take 200 mg by mouth at bedtime.     thyroid (ARMOUR) 15 MG tablet Take 15 mg by mouth daily.     No facility-administered medications prior to visit.    Review of Systems;  Patient denies headache, fevers, malaise, unintentional weight loss, skin rash, eye pain, sinus congestion and sinus pain, sore throat, dysphagia,  hemoptysis , cough, dyspnea, wheezing, chest pain, palpitations, orthopnea, edema, abdominal pain, nausea, melena, diarrhea, constipation, flank pain, dysuria, hematuria, urinary  Frequency, nocturia, numbness, tingling, seizures,  Focal weakness, Loss of consciousness,  Tremor, insomnia, depression, anxiety, and suicidal ideation.  Objective:  LMP 11/15/2014   BP Readings from Last 3 Encounters:  12/17/22 108/62  10/15/22 118/74  12/23/21 120/60    Wt Readings from Last 3 Encounters:  12/17/22 183 lb 6.4 oz (83.2 kg)  12/23/21 184 lb 3.2 oz (83.6 kg)  06/11/21 185 lb 9.6 oz (84.2 kg)    Physical Exam  Lab Results  Component Value Date   HGBA1C 5.5 12/15/2022   HGBA1C 5.8 06/07/2021    Lab Results  Component Value Date   CREATININE 0.91 06/17/2023   CREATININE 0.98 01/08/2023   CREATININE 0.91 12/15/2022    Lab Results  Component Value Date   WBC 4.8 12/15/2022   HGB 14.5 12/15/2022   HCT 42.1 12/15/2022   PLT 174.0 12/15/2022   GLUCOSE 102 (H) 06/17/2023   CHOL 189 06/17/2023   TRIG 80.0  06/17/2023   HDL 46.20 06/17/2023   LDLDIRECT 143.0 06/17/2023   LDLCALC 127 (H) 06/17/2023   ALT 22 06/17/2023   AST 16 06/17/2023   NA 139 06/17/2023   K 4.5 06/17/2023   CL 103 06/17/2023   CREATININE 0.91 06/17/2023   BUN 17 06/17/2023   CO2 29 06/17/2023   TSH 1.68 12/15/2022   HGBA1C 5.5 12/15/2022   MICROALBUR <0.7 12/15/2022    MM 3D SCREENING MAMMOGRAM BILATERAL BREAST  Result Date: 03/31/2023 CLINICAL DATA:  Screening. EXAM: DIGITAL SCREENING BILATERAL MAMMOGRAM WITH TOMOSYNTHESIS AND CAD TECHNIQUE: Bilateral screening digital craniocaudal and mediolateral oblique mammograms were obtained. Bilateral screening digital breast tomosynthesis was performed. The images were evaluated with computer-aided detection. COMPARISON:  Previous exam(s). ACR Breast Density Category a: The breasts are almost entirely fatty. FINDINGS: There are no findings suspicious for malignancy. IMPRESSION: No mammographic evidence of malignancy. A result letter of this screening mammogram will be mailed directly to the patient. RECOMMENDATION: Screening mammogram in one year. (Code:SM-B-01Y) BI-RADS CATEGORY  1: Negative. Electronically Signed   By: Frederico Hamman M.D.   On: 03/31/2023 16:27    Assessment & Plan:  .There are no diagnoses linked to this encounter.   I provided 30 minutes of face-to-face time during this encounter reviewing patient's last visit with me, patient's  most recent visit with cardiology,  nephrology,  and neurology,  recent surgical and non surgical procedures, previous  labs and imaging studies, counseling on currently addressed issues,  and post visit ordering to diagnostics and therapeutics .   Follow-up: No follow-ups on file.   Sherlene Shams, MD

## 2023-06-21 ENCOUNTER — Encounter: Payer: Self-pay | Admitting: Internal Medicine

## 2023-06-21 DIAGNOSIS — Z79890 Hormone replacement therapy (postmenopausal): Secondary | ICD-10-CM | POA: Insufficient documentation

## 2023-06-21 DIAGNOSIS — Z90710 Acquired absence of both cervix and uterus: Secondary | ICD-10-CM | POA: Insufficient documentation

## 2023-06-21 MED ORDER — ATORVASTATIN CALCIUM 10 MG PO TABS: 10.0000 mg | ORAL_TABLET | Freq: Every day | ORAL | 3 refills | Status: DC

## 2023-06-21 NOTE — Assessment & Plan Note (Addendum)
Managed by [facility] with armour thryoid,  progesterone /estrogen  and testosterone

## 2023-06-21 NOTE — Assessment & Plan Note (Signed)
At goal on lisinopril 5 mg daily. 

## 2023-06-21 NOTE — Assessment & Plan Note (Signed)
Continue gyn follow p at [facility] 'clinic

## 2023-07-02 ENCOUNTER — Other Ambulatory Visit: Payer: Self-pay | Admitting: Internal Medicine

## 2023-08-11 ENCOUNTER — Ambulatory Visit: Payer: No Typology Code available for payment source | Admitting: Dermatology

## 2023-08-25 ENCOUNTER — Ambulatory Visit: Payer: No Typology Code available for payment source | Admitting: Dermatology

## 2023-09-01 ENCOUNTER — Ambulatory Visit: Payer: No Typology Code available for payment source | Admitting: Dermatology

## 2023-09-05 ENCOUNTER — Other Ambulatory Visit: Payer: Self-pay | Admitting: Internal Medicine

## 2023-09-29 ENCOUNTER — Ambulatory Visit (INDEPENDENT_AMBULATORY_CARE_PROVIDER_SITE_OTHER): Payer: Self-pay | Admitting: Dermatology

## 2023-09-29 ENCOUNTER — Encounter: Payer: Self-pay | Admitting: Dermatology

## 2023-09-29 DIAGNOSIS — I781 Nevus, non-neoplastic: Secondary | ICD-10-CM

## 2023-09-29 DIAGNOSIS — L719 Rosacea, unspecified: Secondary | ICD-10-CM

## 2023-09-29 DIAGNOSIS — Z7189 Other specified counseling: Secondary | ICD-10-CM

## 2023-09-29 DIAGNOSIS — L988 Other specified disorders of the skin and subcutaneous tissue: Secondary | ICD-10-CM

## 2023-09-29 NOTE — Patient Instructions (Addendum)
 Counseling for BBL / IPL / Laser and Coordination of Care Discussed the treatment option of Broad Band Light (BBL) /Intense Pulsed Light (IPL)/ Laser for skin discoloration, including brown spots and redness.  Typically we recommend at least 1-3 treatment sessions about 5-8 weeks apart for best results.  Cannot have tanned skin when BBL performed, and regular use of sunscreen/photoprotection is advised after the procedure to help maintain results. The patient's condition may also require "maintenance treatments" in the future.  The fee for BBL / laser treatments is $350 per treatment session for the whole face.  A fee can be quoted for other parts of the body.  Insurance typically does not pay for BBL/laser treatments and therefore the fee is an out-of-pocket cost. Recommend prophylactic valtrex treatment. Once scheduled for procedure, will send Rx in prior to patient's appointment.     Due to recent changes in healthcare laws, you may see results of your pathology and/or laboratory studies on MyChart before the doctors have had a chance to review them. We understand that in some cases there may be results that are confusing or concerning to you. Please understand that not all results are received at the same time and often the doctors may need to interpret multiple results in order to provide you with the best plan of care or course of treatment. Therefore, we ask that you please give [place] 2 business days to thoroughly review all your results before contacting the office for clarification. Should we see a critical lab result, you will be contacted sooner.   If You Need Anything After Your Visit  If you have any questions or concerns for your doctor, please call our main line at [phone] and press option 4 to reach your doctor's medical assistant. If no one answers, please leave a voicemail as directed and we will return your call as soon as possible. Messages left after 4 pm will be answered the  following business day.   You may also send [place] a message via MyChart. We typically respond to MyChart messages within 1-2 business days.  For prescription refills, please ask your pharmacy to contact our office. Our fax number is [phone].  If you have an urgent issue when the clinic is closed that cannot wait until the next business day, you can page your doctor at the number below.    Please note that while we do our best to be available for urgent issues outside of office hours, we are not available 24/7.   If you have an urgent issue and are unable to reach [place], you may choose to seek medical care at your doctor's office, retail clinic, urgent care center, or emergency room.  If you have a medical emergency, please immediately call 911 or go to the emergency department.  Pager Numbers  - Dr. Gwen Pounds: [phone]  - Dr. Roseanne Reno: [phone]  - Dr. Katrinka Blazing: [phone]   In the event of inclement weather, please call our main line at [phone] for an update on the status of any delays or closures.  Dermatology Medication Tips: Please keep the boxes that topical medications come in in order to help keep track of the instructions about where and how to use these. Pharmacies typically print the medication instructions only on the boxes and not directly on the medication tubes.   If your medication is too expensive, please contact our office at [phone] option 4 or send [place] a message through MyChart.   We are unable to tell what  your co-pay for medications will be in advance as this is different depending on your insurance coverage. However, we may be able to find a substitute medication at lower cost or fill out paperwork to get insurance to cover a needed medication.   If a prior authorization is required to get your medication covered by your insurance company, please allow [place] 1-2 business days to complete this process.  Drug prices often vary depending on where the  prescription is filled and some pharmacies may offer cheaper prices.  The website www.goodrx.com contains coupons for medications through different pharmacies. The prices here do not account for what the cost may be with help from insurance (it may be cheaper with your insurance), but the website can give you the price if you did not use any insurance.  - You can print the associated coupon and take it with your prescription to the pharmacy.  - You may also stop by our office during regular business hours and pick up a GoodRx coupon card.  - If you need your prescription sent electronically to a different pharmacy, notify our office through [facility] or by phone at [phone] option 4.     Si Usted Necesita Algo Despus de Su Visita  Tambin puede enviarnos un mensaje a travs de Clinical cytogeneticist. Por lo general respondemos a los mensajes de MyChart en el transcurso de 1 a 2 das hbiles.  Para renovar recetas, por favor pida a su farmacia que se ponga en contacto con nuestra oficina. Annie Sable de fax es [place] [phone].  Si tiene un asunto urgente cuando la clnica est cerrada y que no puede esperar hasta el siguiente da hbil, puede llamar/localizar a su doctor(a) al nmero que aparece a continuacin.   Por favor, tenga en cuenta que aunque hacemos todo lo posible para estar disponibles para asuntos urgentes fuera del horario de [place], no estamos disponibles las 24 horas del da, los 7 [address]  [address] de la [place].   Si tiene un problema urgente y no puede comunicarse con nosotros, puede optar por buscar atencin mdica  en el consultorio de su doctor(a), en una clnica privada, en un centro de atencin urgente o en una sala de emergencias.  Si tiene Engineer, drilling, por favor llame inmediatamente al 911 o vaya a la sala de emergencias.  Nmeros de bper  - Dr. Gwen Pounds: [phone]  - Dra. Roseanne Reno: [phone]  - Dr. Katrinka Blazing: [phone]   En caso de inclemencias del  tiempo, por favor llame a Lacy Duverney principal al [phone] para una actualizacin sobre el [place] de cualquier retraso o cierre.  Consejos para la medicacin en dermatologa: Por favor, guarde las cajas en las que vienen los medicamentos de uso tpico para ayudarle a seguir las instrucciones sobre dnde y cmo usarlos. Las farmacias generalmente imprimen las instrucciones del medicamento slo en las cajas y no directamente en los tubos del [place].   Si su medicamento es muy caro, por favor, pngase en contacto con Rolm Gala llamando al [phone] y presione la opcin 4 o envenos un mensaje a travs de Clinical cytogeneticist.   No podemos decirle cul ser su copago por los medicamentos por adelantado ya que esto es diferente dependiendo de la cobertura de su seguro. Sin embargo, es posible que podamos encontrar un medicamento sustituto a Audiological scientist un formulario para que el seguro cubra el medicamento que se considera necesario.   Si se requiere una autorizacin previa para que su compaa de seguros [place] su  medicamento, por favor permtanos de 1 a 2 das hbiles para completar [address]  Los precios de los medicamentos varan con frecuencia dependiendo del Environmental consultant de dnde se surte la receta y alguna farmacias pueden ofrecer precios ms baratos.  El sitio web www.goodrx.com tiene cupones para medicamentos de Health and safety inspector. Los precios aqu no tienen en cuenta lo que podra costar con la ayuda del seguro (puede ser ms barato con su seguro), pero el sitio web puede darle el precio si no utiliz Tourist information centre manager.  - Puede imprimir el cupn correspondiente y llevarlo con su receta a la farmacia.  - Tambin puede pasar por nuestra oficina durante el horario de atencin regular y Education officer, museum una tarjeta de cupones de GoodRx.  - Si necesita que su receta se enve electrnicamente a una farmacia diferente, informe a nuestra oficina a travs de MyChart de [place] o por telfono  llamando al [phone] y presione la opcin 4.

## 2023-09-29 NOTE — Progress Notes (Signed)
 Follow-Up Visit   Subjective  Tonya Gregory is a 63 y.o. female who presents for the following: Botox for facial elastosis to discuss redness at face and need for fillers.   The following portions of the chart were reviewed this encounter and updated as appropriate: medications, allergies, medical history  Review of Systems:  No other skin or systemic complaints except as noted in HPI or Assessment and Plan.  Objective  Well appearing patient in no apparent distress; mood and affect are within normal limits.  A focused examination was performed of the face.  Relevant physical exam findings are noted in the Assessment and Plan.                   Assessment & Plan   ELASTOSIS OF SKIN   TELANGIECTASIA   Facial Elastosis Botox 35 units injected as marked: - Frown complex 25 units - Brow lift 5 units each for a total of 10 units  Discussed need for filler for oral commissures, nasolabial folds, and lateral chin in future to be evaluated by Dr. Roseanne Reno.   Photos taken today   Location: Frown Complex, Brow lift   Informed consent: Discussed risks (infection, pain, bleeding, bruising, swelling, allergic reaction, paralysis of nearby muscles, eyelid droop, double vision, neck weakness, difficulty breathing, headache, undesirable cosmetic result, and need for additional treatment) and benefits of the procedure, as well as the alternatives.  Informed consent was obtained.  Preparation: The area was cleansed with alcohol.  Procedure Details:  Botox was injected into the dermis with a 30-gauge needle. Pressure applied to any bleeding. Ice packs offered for swelling.  Lot Number:  [id number] Expiration:  10/2025  Total Units Injected:  35  Plan: Tylenol may be used for headache.  Allow 2 weeks before returning to clinic for additional dosing as needed. Patient will call for any problems.   ROSACEA Exam Mid face erythema with telangiectasias +/- scattered  inflammatory papules Left greater than right cheek  Cheeks and chin  Chronic and persistent condition with duration or expected duration over one year. Condition is symptomatic/ bothersome to patient. Not currently at goal.   Rosacea is a chronic progressive skin condition usually affecting the face of adults, causing redness and/or acne bumps. It is treatable but not curable. It sometimes affects the eyes (ocular rosacea) as well. It may respond to topical and/or systemic medication and can flare with stress, sun exposure, alcohol, exercise, topical steroids (including hydrocortisone/cortisone 10) and some foods.  Daily application of broad spectrum spf 30+ sunscreen to face is recommended to reduce flares.  Patient denies grittiness of the eyes  Treatment Plan Counseling for BBL / IPL / Laser and Coordination of Care Discussed the treatment option of Broad Band Light (BBL) /Intense Pulsed Light (IPL)/ Laser for skin discoloration, including brown spots and redness.  Typically we recommend at least 1-3 treatment sessions about 5-8 weeks apart for best results.  Cannot have tanned skin when BBL performed, and regular use of sunscreen/photoprotection is advised after the procedure to help maintain results. The patient's condition may also require "maintenance treatments" in the future.  The fee for BBL / laser treatments is $350 per treatment session for the whole face.  A fee can be quoted for other parts of the body.  Insurance typically does not pay for BBL/laser treatments and therefore the fee is an out-of-pocket cost. Recommend prophylactic valtrex treatment. Once scheduled for procedure, will send Rx in prior to patient's appointment.  Return for 3 - 4 month botox .  IAsher Muir, CMA, am acting as scribe for Armida Sans, MD.   Documentation: I have reviewed the above documentation for accuracy and completeness, and I agree with the above.  Armida Sans, MD

## 2023-09-30 ENCOUNTER — Encounter: Payer: Self-pay | Admitting: Dermatology

## 2023-09-30 ENCOUNTER — Ambulatory Visit (INDEPENDENT_AMBULATORY_CARE_PROVIDER_SITE_OTHER): Payer: Self-pay | Admitting: Dermatology

## 2023-09-30 DIAGNOSIS — W908XXA Exposure to other nonionizing radiation, initial encounter: Secondary | ICD-10-CM

## 2023-09-30 DIAGNOSIS — I781 Nevus, non-neoplastic: Secondary | ICD-10-CM

## 2023-09-30 DIAGNOSIS — L814 Other melanin hyperpigmentation: Secondary | ICD-10-CM

## 2023-09-30 DIAGNOSIS — L578 Other skin changes due to chronic exposure to nonionizing radiation: Secondary | ICD-10-CM

## 2023-09-30 DIAGNOSIS — L719 Rosacea, unspecified: Secondary | ICD-10-CM

## 2023-09-30 NOTE — Progress Notes (Signed)
 Follow-Up Visit   Subjective  Tonya Gregory is a 63 y.o. female who presents for the following: for BBL treatment for rosacea and telangiectasis at face  The patient has spots, moles and lesions to be evaluated, some may be new or changing and the patient may have concern these could be cancer.   The following portions of the chart were reviewed this encounter and updated as appropriate: medications, allergies, medical history  Review of Systems:  No other skin or systemic complaints except as noted in HPI or Assessment and Plan.  Objective  Well appearing patient in no apparent distress; mood and affect are within normal limits.  A focused examination was performed of the following areas: face  Relevant exam findings are noted in the Assessment and Plan.                    Assessment & Plan   ROSACEA with Telangiectasias at face   Exam: Mid face erythema with telangiectasias see photos   Chronic and persistent condition with duration or expected duration over one year. Condition is bothersome/symptomatic for patient. Currently flared.   Rosacea is a chronic progressive skin condition usually affecting the face of adults, causing redness and/or acne bumps. It is treatable but not curable. It sometimes affects the eyes (ocular rosacea) as well. It may respond to topical and/or systemic medication and can flare with stress, sun exposure, alcohol, exercise, topical steroids (including hydrocortisone/cortisone 10) and some foods.  Daily application of broad spectrum spf 30+ sunscreen to face is recommended to reduce flares.  Patient denies grittiness of the eyes  Treatment Plan BBL treatment today at face  Recommend up to 3 treatments   Laser safety: Patient was advised in laser safety.  Patient was fitted with laser safety goggles and advised to keep eyes closed during procedure with goggles on. Staff and provider ensured that patient and their own safety  goggles were also on and eyes protected during procedure. Laser room door was secured and locked from the inside. Laser room door has laser safety sign affixed to the outside of the door.   Patient given laser starter kit and cooling gel mask   Prior to the procedure, the patient's past medical history, medications, allergies, and the rare but potential risks and complications were reviewed with the patient and a signed consent was obtained.  Pre and post treatment care was discussed and instructions provided.    [place] BBL - 09/30/23 1400      Patient Details   Skin Type: II    Anesthestic Cream Applied: No    Photo Takes: Yes    Consent Signed: Yes      Treatment Details   Date: 09/30/23    Treatment #: 1    Area: face    Filter: 1st Pass;2nd Pass      1st Pass   Location: F    Device: 560   vascular areas at b/l cheeks, chin, nose   BBL j/cm2: 26    PW Msec Sec: 27    Cooling Temp: 20    Pulses: 99    7mm: this one     Patient tolerated the procedure well.    Wynelle Link avoidance was stressed. The patient will call with any problems, questions or concerns prior to their next appointment.  LENTIGINES Exam: scattered tan macules at cheeks  Treatment Plan: BBL treatment today at B/l Cheeks   Laser safety: Patient was advised in laser safety.  Patient was fitted with laser safety goggles and advised to keep eyes closed during procedure with goggles on. Staff and provider ensured that patient and their own safety goggles were also on and eyes protected during procedure. Laser room door was secured and locked from the inside. Laser room door has laser safety sign affixed to the outside of the door.   Patient given laser starter kit and cooling gel mask   Prior to the procedure, the patient's past medical history, medications, allergies, and the rare but potential risks and complications were reviewed with the patient and a signed consent was obtained.  Pre and post treatment  care was discussed and instructions provided.   [place] BBL - 09/30/23 1400      Patient Details   Skin Type: II    Anesthestic Cream Applied: No    Photo Takes: Yes    Consent Signed: Yes      Treatment Details   Date: 09/30/23    Treatment #: 1    Area: face    Filter: 1st Pass;2nd Pass     2nd Pass   Location: F    Device: 515   pigmented lesions at b/l cheeks   BBL j/cm2: 15    PW Msec Sec: 15    Cooling Temp: 15    Pulses: 14    11mm: this one     ACTINIC DAMAGE - chronic, secondary to cumulative UV radiation exposure/sun exposure over time - diffuse scaly erythematous macules with underlying dyspigmentation - Recommend daily broad spectrum sunscreen SPF 30+ to sun-exposed areas, reapply every 2 hours as needed.  - Recommend staying in the shade or wearing long sleeves, sun glasses (UVA+UVB protection) and wide brim hats (4-inch brim around the entire circumference of the hat). - Call for new or changing lesions.   Return for schedule BBL .  IAsher Muir, CMA, am acting as scribe for Armida Sans, MD.   Documentation: I have reviewed the above documentation for accuracy and completeness, and I agree with the above.  Armida Sans, MD

## 2023-09-30 NOTE — Patient Instructions (Addendum)

## 2023-11-30 ENCOUNTER — Ambulatory Visit: Payer: No Typology Code available for payment source | Admitting: Dermatology

## 2023-12-07 ENCOUNTER — Other Ambulatory Visit: Payer: Self-pay | Admitting: Internal Medicine

## 2023-12-14 ENCOUNTER — Other Ambulatory Visit (INDEPENDENT_AMBULATORY_CARE_PROVIDER_SITE_OTHER): Payer: Self-pay

## 2023-12-14 ENCOUNTER — Telehealth: Payer: Self-pay | Admitting: Internal Medicine

## 2023-12-14 DIAGNOSIS — K760 Fatty (change of) liver, not elsewhere classified: Secondary | ICD-10-CM

## 2023-12-14 LAB — CBC WITH DIFFERENTIAL/PLATELET
Basophils Absolute: 0 10*3/uL (ref 0.0–0.1)
Basophils Relative: 0.7 % (ref 0.0–3.0)
Eosinophils Absolute: 0.2 10*3/uL (ref 0.0–0.7)
Eosinophils Relative: 2.9 % (ref 0.0–5.0)
HCT: 43.8 % (ref 36.0–46.0)
Hemoglobin: 15 g/dL (ref 12.0–15.0)
Lymphocytes Relative: 36.6 % (ref 12.0–46.0)
Lymphs Abs: 2 10*3/uL (ref 0.7–4.0)
MCHC: 34.1 g/dL (ref 30.0–36.0)
MCV: 94.5 fl (ref 78.0–100.0)
Monocytes Absolute: 0.4 10*3/uL (ref 0.1–1.0)
Monocytes Relative: 8.3 % (ref 3.0–12.0)
Neutro Abs: 2.8 10*3/uL (ref 1.4–7.7)
Neutrophils Relative %: 51.5 % (ref 43.0–77.0)
Platelets: 207 10*3/uL (ref 150.0–400.0)
RBC: 4.64 Mil/uL (ref 3.87–5.11)
RDW: 12.6 % (ref 11.5–15.5)
WBC: 5.4 10*3/uL (ref 4.0–10.5)

## 2023-12-14 LAB — COMPREHENSIVE METABOLIC PANEL WITH GFR
ALT: 20 U/L (ref 0–35)
AST: 16 U/L (ref 0–37)
Albumin: 4.5 g/dL (ref 3.5–5.2)
Alkaline Phosphatase: 42 U/L (ref 39–117)
BUN: 19 mg/dL (ref 6–23)
CO2: 26 meq/L (ref 19–32)
Calcium: 9.6 mg/dL (ref 8.4–10.5)
Chloride: 103 meq/L (ref 96–112)
Creatinine, Ser: 0.88 mg/dL (ref 0.40–1.20)
GFR: 69.79 mL/min (ref >60.00)
Glucose, Bld: 107 mg/dL — ABNORMAL HIGH (ref 70–99)
Potassium: 4.5 meq/L (ref 3.5–5.1)
Sodium: 138 meq/L (ref 135–145)
Total Bilirubin: 1.1 mg/dL (ref 0.2–1.2)
Total Protein: 7.2 g/dL (ref 6.0–8.3)

## 2023-12-14 LAB — HEMOGLOBIN A1C: Hgb A1c MFr Bld: 5.1 % (ref 4.6–6.5)

## 2023-12-14 LAB — PROTIME-INR
INR: 1.1 ratio — ABNORMAL HIGH (ref 0.8–1.0)
Prothrombin Time: 11.2 s (ref 9.6–13.1)

## 2023-12-14 LAB — TSH: TSH: 2.21 u[IU]/mL (ref 0.35–5.50)

## 2023-12-14 NOTE — Telephone Encounter (Signed)
 Pt states she was bitten by a tick this weekend and is wondering if she needs to be seen or will she need to take antibiotics. She does have a physical scheduled this week, she was advised that it would be a billable appt if she speaks about issues outside of guidelines for physical visits. Please reach out to pt, we will schedule a visit should she need one. [facility]

## 2023-12-14 NOTE — Telephone Encounter (Signed)
 Spoke with pt and she stated that the tick bite is read around it and very itchy. Pt would like to schedule an appt to have it looked it. Pt has been scheduled to see Dr. Casimir Cleaver tomorrow. Pt does not have any other symptoms.

## 2023-12-15 ENCOUNTER — Ambulatory Visit

## 2023-12-15 LAB — LIPID PANEL W/REFLEX DIRECT LDL
Cholesterol: 193 mg/dL (ref <200)
HDL: 51 mg/dL (ref >=50)
LDL Cholesterol (Calc): 121 mg/dL — ABNORMAL HIGH
Non-HDL Cholesterol (Calc): 142 mg/dL — ABNORMAL HIGH (ref <130)
Total CHOL/HDL Ratio: 3.8 (calc) (ref <5.0)
Triglycerides: 105 mg/dL (ref <150)

## 2023-12-16 ENCOUNTER — Ambulatory Visit: Payer: Self-pay | Admitting: Internal Medicine

## 2023-12-17 ENCOUNTER — Ambulatory Visit (INDEPENDENT_AMBULATORY_CARE_PROVIDER_SITE_OTHER): Payer: Self-pay | Admitting: Internal Medicine

## 2023-12-17 ENCOUNTER — Encounter: Payer: Self-pay | Admitting: Internal Medicine

## 2023-12-17 VITALS — BP 120/66 | HR 67 | Ht 68.0 in | Wt 162.0 lb

## 2023-12-17 DIAGNOSIS — Z0001 Encounter for general adult medical examination with abnormal findings: Secondary | ICD-10-CM

## 2023-12-17 DIAGNOSIS — K760 Fatty (change of) liver, not elsewhere classified: Secondary | ICD-10-CM

## 2023-12-17 DIAGNOSIS — S70361A Insect bite (nonvenomous), right thigh, initial encounter: Secondary | ICD-10-CM

## 2023-12-17 DIAGNOSIS — Z0000 Encounter for general adult medical examination without abnormal findings: Secondary | ICD-10-CM

## 2023-12-17 DIAGNOSIS — W57XXXA Bitten or stung by nonvenomous insect and other nonvenomous arthropods, initial encounter: Secondary | ICD-10-CM

## 2023-12-17 DIAGNOSIS — S70269A Insect bite (nonvenomous), unspecified hip, initial encounter: Secondary | ICD-10-CM

## 2023-12-17 DIAGNOSIS — Z1211 Encounter for screening for malignant neoplasm of colon: Secondary | ICD-10-CM

## 2023-12-17 DIAGNOSIS — I10 Essential (primary) hypertension: Secondary | ICD-10-CM

## 2023-12-17 MED ORDER — DOXYCYCLINE HYCLATE 100 MG PO TABS: 100.0000 mg | ORAL_TABLET | Freq: Two times a day (BID) | ORAL | 0 refills | Status: DC

## 2023-12-17 MED ORDER — AMLODIPINE BESYLATE 2.5 MG PO TABS: 2.5000 mg | ORAL_TABLET | Freq: Every day | ORAL | 1 refills | Status: DC

## 2023-12-17 MED ORDER — EZETIMIBE 10 MG PO TABS: 10.0000 mg | ORAL_TABLET | Freq: Every day | ORAL | 2 refills | Status: DC

## 2023-12-17 MED ORDER — FLUCONAZOLE 150 MG PO TABS: 150.0000 mg | ORAL_TABLET | Freq: Every day | ORAL | 0 refills | Status: DC

## 2023-12-17 NOTE — Patient Instructions (Addendum)
 1) I am changing your blood pressure medication from lisinopril  to amlodipine.  I HAVE ALSO SENT IN A SCRIPT FOR NICK AS WELL   2)  Your cholesterol is still mildly elevated without medication. Your LDL is 121  Goal  Is 70 to 100.   If you are willing to try Zetia, you may tolerate it better than the statins that you did not tolerate previously.  It works by inhibiting the absorption of cholesterol by the small intestine, so it lowers LDL . It is very well tolerated by the majority of statin intolerant patients who try it.   I will send it to your pharmacy for a 3 month trial.    3) start the doxycycline today for the tick bite  you can return for tick illness antibody tests in 5 weeks (RMSF. Lyme and Erlichiosis)

## 2023-12-17 NOTE — Progress Notes (Deleted)
 Subjective:  Patient ID: Tonya Gregory, female    DOB: 03-Nov-1960  Age: 63 y.o. MRN: [id number]  CC: There were no encounter diagnoses.   HPI Tonya Gregory presents for  Chief Complaint  Patient presents with   Medical Management of Chronic Issues    6 month follow up     1) HTN  2) HLD:  stopped lipitor seveal months ago due to side effects of diffuse myalgias.  Untreated lipids reviewed  3) Fatty liver:  exercising and mediterranean diet   4) tick bite of right hip found on Saturday after working in yard huband rmoved it,  area is warm red and intensely itchy    Outpatient Medications Prior to Visit  Medication Sig Dispense Refill   acetaminophen  (TYLENOL ) 500 MG tablet Take 500 mg by mouth every 6 (six) hours as needed (for pain/headaches.).     albuterol  (VENTOLIN  HFA) 108 (90 Base) MCG/ACT inhaler Inhale 1 puff into the lungs every 6 (six) hours as needed for wheezing or shortness of breath. 8.5 g 3   atorvastatin  (LIPITOR) 10 MG tablet Take 1 tablet (10 mg total) by mouth daily. 90 tablet 3   ibuprofen  (ADVIL ,MOTRIN ) 200 MG tablet Take 400 mg by mouth every 8 (eight) hours as needed (for pain/headaches.).     ketoconazole  (NIZORAL ) 2 % shampoo Shampoo onto skin let sit 5-10 minutes then wash off. Use 3d/wk for 6 weeks. Then QM thereafter. 120 mL 5   lisinopril  (ZESTRIL ) 5 MG tablet TAKE 1 TABLET BY MOUTH DAILY 90 tablet 1   Nutritional Supplements (JUICE PLUS FIBRE PO) Take 6 capsules by mouth daily. Take 2 tablet Juice Plus Fruit, 2 tablets of Vegetable, & 2 tablets of Berry Blend in the morning     Omega-3 Fatty Acids (FISH OIL PO) Take 1 capsule by mouth daily.     PRESCRIPTION MEDICATION PT GETTING TESTOSTERONE PELLETS INJECTED AT [facility] IN [facility] WITH DR Tonya Gregory INJECTED DEC 2018     progesterone (PROMETRIUM) 200 MG capsule Take 200 mg by mouth at bedtime.     thyroid (ARMOUR) 15 MG tablet Take 15 mg by mouth daily.     fluconazole   (DIFLUCAN ) 150 MG tablet Take 1 tablet (150 mg total) by mouth daily. (Patient not taking: Reported on 12/17/2023) 2 tablet 0   No facility-administered medications prior to visit.    Review of Systems;  Patient denies headache, fevers, malaise, unintentional weight loss, skin rash, eye pain, sinus congestion and sinus pain, sore throat, dysphagia,  hemoptysis , cough, dyspnea, wheezing, chest pain, palpitations, orthopnea, edema, abdominal pain, nausea, melena, diarrhea, constipation, flank pain, dysuria, hematuria, urinary  Frequency, nocturia, numbness, tingling, seizures,  Focal weakness, Loss of consciousness,  Tremor, insomnia, depression, anxiety, and suicidal ideation.      Objective:  BP 120/66   Pulse 67   Ht 5\' 8"  (1.727 m)   Wt 162 lb (73.5 kg)   LMP 11/15/2014   SpO2 98%   BMI 24.63 kg/m   BP Readings from Last 3 Encounters:  12/17/23 120/66  06/19/23 110/72  12/17/22 108/62    Wt Readings from Last 3 Encounters:  12/17/23 162 lb (73.5 kg)  06/19/23 167 lb 3.2 oz (75.8 kg)  12/17/22 183 lb 6.4 oz (83.2 kg)    Physical Exam Vitals reviewed.  Constitutional:      General: She is not in acute distress.    Appearance: Normal appearance. She is normal weight. She is not ill-appearing, toxic-appearing  or diaphoretic.  HENT:     Head: Normocephalic.  Eyes:     General: No scleral icterus.       Right eye: No discharge.        Left eye: No discharge.     Conjunctiva/sclera: Conjunctivae normal.  Cardiovascular:     Rate and Rhythm: Normal rate and regular rhythm.     Heart sounds: Normal heart sounds.  Pulmonary:     Effort: Pulmonary effort is normal. No respiratory distress.     Breath sounds: Normal breath sounds.  Musculoskeletal:        General: Normal range of motion.  Skin:    General: Skin is warm and dry.     Findings: Erythema, lesion and rash present.          Comments: Large raised erythematous lesion with central punctum c/w/ insect bite    Neurological:     General: No focal deficit present.     Mental Status: She is alert and oriented to person, place, and time. Mental status is at baseline.  Psychiatric:        Mood and Affect: Mood normal.        Behavior: Behavior normal.        Thought Content: Thought content normal.        Judgment: Judgment normal.   Lab Results  Component Value Date   HGBA1C 5.1 12/14/2023   HGBA1C 5.5 12/15/2022   HGBA1C 5.8 06/07/2021    Lab Results  Component Value Date   CREATININE 0.88 12/14/2023   CREATININE 0.91 06/17/2023   CREATININE 0.98 01/08/2023    Lab Results  Component Value Date   WBC 5.4 12/14/2023   HGB 15.0 12/14/2023   HCT 43.8 12/14/2023   PLT 207.0 12/14/2023   GLUCOSE 107 (H) 12/14/2023   CHOL 193 12/14/2023   TRIG 105 12/14/2023   HDL 51 12/14/2023   LDLDIRECT 143.0 06/17/2023   LDLCALC 121 (H) 12/14/2023   ALT 20 12/14/2023   AST 16 12/14/2023   NA 138 12/14/2023   K 4.5 12/14/2023   CL 103 12/14/2023   CREATININE 0.88 12/14/2023   BUN 19 12/14/2023   CO2 26 12/14/2023   TSH 2.21 12/14/2023   INR 1.1 (H) 12/14/2023   HGBA1C 5.1 12/14/2023   MICROALBUR <0.7 12/15/2022    MM 3D SCREENING MAMMOGRAM BILATERAL BREAST Result Date: 03/31/2023 CLINICAL DATA:  Screening. EXAM: DIGITAL SCREENING BILATERAL MAMMOGRAM WITH TOMOSYNTHESIS AND CAD TECHNIQUE: Bilateral screening digital craniocaudal and mediolateral oblique mammograms were obtained. Bilateral screening digital breast tomosynthesis was performed. The images were evaluated with computer-aided detection. COMPARISON:  Previous exam(s). ACR Breast Density Category a: The breasts are almost entirely fatty. FINDINGS: There are no findings suspicious for malignancy. IMPRESSION: No mammographic evidence of malignancy. A result letter of this screening mammogram will be mailed directly to the patient. RECOMMENDATION: Screening mammogram in one year. (Code:SM-B-01Y) BI-RADS CATEGORY  1: Negative. Electronically  Signed   By: Alinda Apley M.D.   On: 03/31/2023 16:27    Assessment & Plan:  .There are no diagnoses linked to this encounter.   I spent 34 minutes on the day of this face to face encounter reviewing patient's  most recent visit with cardiology,  nephrology,  and neurology,  prior relevant surgical and non surgical procedures, recent  labs and imaging studies, counseling on weight management,  reviewing the assessment and plan with patient, and post visit ordering and reviewing of  diagnostics and therapeutics with patient  .  Follow-up: No follow-ups on file.   Thersia Flax, MD

## 2023-12-19 DIAGNOSIS — S70369A Insect bite (nonvenomous), unspecified thigh, initial encounter: Secondary | ICD-10-CM | POA: Insufficient documentation

## 2023-12-19 NOTE — Assessment & Plan Note (Signed)

## 2023-12-19 NOTE — Progress Notes (Signed)
 Patient ID: Tonya Gregory, female    DOB: 04/08/1961  Age: 63 y.o. MRN: [id number]  The patient is here for annual preventive examination and management of other chronic and acute problems.   The risk factors are reflected in the social history.   The roster of all physicians providing medical care to patient - is listed in the Snapshot section of the chart.   Activities of daily living:  The patient is 100% independent in all ADLs: dressing, toileting, feeding as well as independent mobility   Home safety : The patient has smoke detectors in the home. They wear seatbelts.  There are no unsecured firearms at home. There is no violence in the home.    There is no risks for hepatitis, STDs or HIV. There is no   history of blood transfusion. They have no travel history to infectious disease endemic areas of the world.   The patient has seen their dentist in the last six month. They have seen their eye doctor in the last year. The patinet  denies slight hearing difficulty with regard to whispered voices and some television programs.  They have deferred audiologic testing in the last year.  They do not  have excessive sun exposure. Discussed the need for sun protection: hats, long sleeves and use of sunscreen if there is significant sun exposure.    Diet: the importance of a healthy diet is discussed. They do have a healthy diet.   The benefits of regular aerobic exercise were discussed. The patient  exercises  3 to 5 days per week  for  60 minutes.    Depression screen: there are no signs or vegative symptoms of depression- irritability, change in appetite, anhedonia, sadness/tearfullness.   The following portions of the patient's history were reviewed and updated as appropriate: allergies, current medications, past family history, past medical history,  past surgical history, past social history  and problem list.   Visual acuity was not assessed per patient preference since the patient has  regular follow up with an  ophthalmologist. Hearing and body mass index were assessed and reviewed.    During the course of the visit the patient was educated and counseled about appropriate screening and preventive services including : fall prevention , diabetes screening, nutrition counseling, colorectal cancer screening, and recommended immunizations.    Chief Complaint:    1) HTN:  Hypertension: patient checks blood pressure twice weekly at home.  Readings have been for the most part <130/80 at rest . Patient is following a reduced salt diet most days and is taking medications as prescribed (lisinopril )   2) HLD:  stopped lipitor seveal months ago due to side effects of diffuse myalgias.  Untreated lipids reviewed  3) Fatty liver:  exercising and adhering to a  mediterranean diet .  Alcohol is moderate 1 or less glasses of wine daily   4) tick bite of right hip found on Saturday after working in yard:  husband removed it,  but hte area has become enflamed and is  intensely itchy   Review of Symptoms  Patient denies headache, fevers, malaise, unintentional weight loss, skin rash, eye pain, sinus congestion and sinus pain, sore throat, dysphagia,  hemoptysis , cough, dyspnea, wheezing, chest pain, palpitations, orthopnea, edema, abdominal pain, nausea, melena, diarrhea, constipation, flank pain, dysuria, hematuria, urinary  Frequency, nocturia, numbness, tingling, seizures,  Focal weakness, Loss of consciousness,  Tremor, insomnia, depression, anxiety, and suicidal ideation.    Physical Exam:  BP 120/66  Pulse 67   Ht 5\' 8"  (1.727 m)   Wt 162 lb (73.5 kg)   LMP 11/15/2014   SpO2 98%   BMI 24.63 kg/m    Physical Exam  Assessment and Plan: Encounter for preventive health examination Assessment & Plan: age appropriate education and counseling updated, referrals for preventative services and immunizations addressed, dietary and smoking counseling addressed, most recent labs  reviewed.  I have personally reviewed and have noted:   1) the patient's medical and social history 2) The pt's use of alcohol, tobacco, and illicit drugs 3) The patient's current medications and supplements 4) Functional ability including ADL's, fall risk, home safety risk, hearing and visual impairment 5) Diet and physical activities 6) Evidence for depression or mood disorder 7) The patient's height, weight, and BMI have been recorded in the chart  I have made referrals, and provided counseling and education based on review of the above    Tick bite of hip, initial encounter -     Doxycycline  Hyclate; Take 1 tablet (100 mg total) by mouth 2 (two) times daily.  Dispense: 14 tablet; Refill: 0 -     Lyme Disease Abs IgG, IgM, IFA, CSF; Future -     Rocky mtn spotted fvr abs pnl(IgG+IgM); Future -     Human Granulocytic Ehrlich-HGE; Future  Colon cancer screening -     Cologuard  Hepatic steatosis Assessment & Plan: She discontinued statin due to diffuse myalgias.  LFTs are normal   Lab Results  Component Value Date   ALT 20 12/14/2023   AST 16 12/14/2023   ALKPHOS 42 12/14/2023   BILITOT 1.1 12/14/2023      Primary hypertension Assessment & Plan: At goal on  lisinopril   5 mg daily . Changing medication to amlodipine  as she has no proteinuria   Lab Results  Component Value Date   MICROALBUR <0.7 12/15/2022   MICROALBUR <0.7 12/20/2021        Tick bite of right thigh with local reaction, initial encounter Assessment & Plan: Empiric doxycycline  prescribed.  Antibody panel ordered for future collection    Other orders -     amLODIPine  Besylate; Take 1 tablet (2.5 mg total) by mouth daily.  Dispense: 90 tablet; Refill: 1 -     Ezetimibe ; Take 1 tablet (10 mg total) by mouth daily.  Dispense: 30 tablet; Refill: 2 -     Fluconazole ; Take 1 tablet (150 mg total) by mouth daily.  Dispense: 2 tablet; Refill: 0    Return in about 6 months (around  06/18/2024).  Thersia Flax, MD

## 2023-12-19 NOTE — Assessment & Plan Note (Signed)
 At goal on  lisinopril   5 mg daily . Changing medication to amlodipine  as she has no proteinuria   Lab Results  Component Value Date   MICROALBUR <0.7 12/15/2022   MICROALBUR <0.7 12/20/2021

## 2023-12-19 NOTE — Assessment & Plan Note (Signed)
 She discontinued statin due to diffuse myalgias.  LFTs are normal   Lab Results  Component Value Date   ALT 20 12/14/2023   AST 16 12/14/2023   ALKPHOS 42 12/14/2023   BILITOT 1.1 12/14/2023

## 2023-12-19 NOTE — Assessment & Plan Note (Signed)
 Empiric doxycycline  prescribed.  Antibody panel ordered for future collection

## 2024-01-12 ENCOUNTER — Encounter: Payer: Self-pay | Admitting: Dermatology

## 2024-01-12 ENCOUNTER — Ambulatory Visit (INDEPENDENT_AMBULATORY_CARE_PROVIDER_SITE_OTHER): Payer: Self-pay | Admitting: Dermatology

## 2024-01-12 DIAGNOSIS — L988 Other specified disorders of the skin and subcutaneous tissue: Secondary | ICD-10-CM

## 2024-01-12 NOTE — Patient Instructions (Addendum)
 For spots on legs and arms IGH is a benign chronic condition of sun-exposed skin, most commonly affecting the arms and legs. Cause is unknown but it can be a genetic trait. It is not related to vitiligo. There is no treatment. Recommend photoprotection and regular use of spf 30 or higher sunscreen which may help prevent the development of more white spots.        Due to recent changes in healthcare laws, you may see results of your pathology and/or laboratory studies on MyChart before the doctors have had a chance to review them. We understand that in some cases there may be results that are confusing or concerning to you. Please understand that not all results are received at the same time and often the doctors may need to interpret multiple results in order to provide you with the best plan of care or course of treatment. Therefore, we ask that you please give us  2 business days to thoroughly review all your results before contacting the office for clarification. Should we see a critical lab result, you will be contacted sooner.   If You Need Anything After Your Visit  If you have any questions or concerns for your doctor, please call our main line at [phone] and press option 4 to reach your doctor's medical assistant. If no one answers, please leave a voicemail as directed and we will return your call as soon as possible. Messages left after 4 pm will be answered the following business day.   You may also send us  a message via MyChart. We typically respond to MyChart messages within 1-2 business days.  For prescription refills, please ask your pharmacy to contact our office. Our fax number is [phone].  If you have an urgent issue when the clinic is closed that cannot wait until the next business day, you can page your doctor at the number below.    Please note that while we do our best to be available for urgent issues outside of office hours, we are not available 24/7.   If you  have an urgent issue and are unable to reach us , you may choose to seek medical care at your doctor's office, retail clinic, urgent care center, or emergency room.  If you have a medical emergency, please immediately call 911 or go to the emergency department.  Pager Numbers  - Dr. Bary Likes: [phone]  - Dr. Annette Barters: [phone]  - Dr. Felipe Horton: [phone]   In the event of inclement weather, please call our main line at [phone] for an update on the status of any delays or closures.  Dermatology Medication Tips: Please keep the boxes that topical medications come in in order to help keep track of the instructions about where and how to use these. Pharmacies typically print the medication instructions only on the boxes and not directly on the medication tubes.   If your medication is too expensive, please contact our office at [phone] option 4 or send us  a message through MyChart.   We are unable to tell what your co-pay for medications will be in advance as this is different depending on your insurance coverage. However, we may be able to find a substitute medication at lower cost or fill out paperwork to get insurance to cover a needed medication.   If a prior authorization is required to get your medication covered by your insurance company, please allow us  1-2 business days to complete this process.  Drug prices often vary depending on where the  prescription is filled and some pharmacies may offer cheaper prices.  The website www.goodrx.com contains coupons for medications through different pharmacies. The prices here do not account for what the cost may be with help from insurance (it may be cheaper with your insurance), but the website can give you the price if you did not use any insurance.  - You can print the associated coupon and take it with your prescription to the pharmacy.  - You may also stop by our office during regular business hours and pick up a GoodRx coupon  card.  - If you need your prescription sent electronically to a different pharmacy, notify our office through [facility] or by phone at [phone] option 4.     Si Usted Necesita Algo Despus de Su Visita  Tambin puede enviarnos un mensaje a travs de Clinical cytogeneticist. Por lo general respondemos a los mensajes de MyChart en el transcurso de 1 a 2 das hbiles.  Para renovar recetas, por favor pida a su farmacia que se ponga en contacto con nuestra oficina. Franz Jacks de fax es [place] [phone].  Si tiene un asunto urgente cuando la clnica est cerrada y que no puede esperar hasta el siguiente da hbil, puede llamar/localizar a su doctor(a) al nmero que aparece a continuacin.   Por favor, tenga en cuenta que aunque hacemos todo lo posible para estar disponibles para asuntos urgentes fuera del horario de [place], no estamos disponibles las 24 horas del da, los 7 [address]  [address] de la [place].   Si tiene un problema urgente y no puede comunicarse con nosotros, puede optar por buscar atencin mdica  en el consultorio de su doctor(a), en una clnica privada, en un centro de atencin urgente o en una sala de emergencias.  Si tiene Engineer, drilling, por favor llame inmediatamente al 911 o vaya a la sala de emergencias.  Nmeros de bper  - Dr. Bary Likes: [phone]  - Dra. Annette Barters: [phone]  - Dr. Felipe Horton: [phone]   En caso de inclemencias del tiempo, por favor llame a Lajuan Pila principal al [phone] para una actualizacin sobre el [place] de cualquier retraso o cierre.  Consejos para la medicacin en dermatologa: Por favor, guarde las cajas en las que vienen los medicamentos de uso tpico para ayudarle a seguir las instrucciones sobre dnde y cmo usarlos. Las farmacias generalmente imprimen las instrucciones del medicamento slo en las cajas y no directamente en los tubos del [place].   Si su medicamento es muy caro, por favor, pngase en contacto con Bettyjane Brunet llamando al [phone] y presione la opcin 4 o envenos un mensaje a travs de Clinical cytogeneticist.   No podemos decirle cul ser su copago por los medicamentos por adelantado ya que esto es diferente dependiendo de la cobertura de su seguro. Sin embargo, es posible que podamos encontrar un medicamento sustituto a Audiological scientist un formulario para que el seguro cubra el medicamento que se considera necesario.   Si se requiere una autorizacin previa para que su compaa de seguros [place] su medicamento, por favor permtanos de 1 a 2 das hbiles para completar este proceso.  Los precios de los medicamentos varan con frecuencia dependiendo del Environmental consultant de dnde se surte la receta y alguna farmacias pueden ofrecer precios ms baratos.  El sitio web www.goodrx.com tiene cupones para medicamentos de Health and safety inspector. Los precios aqu no tienen en cuenta lo que podra costar con la ayuda del seguro (puede ser ms barato con su seguro), pero el sitio  web puede darle el precio si no Visual merchandiser.  - Puede imprimir el cupn correspondiente y llevarlo con su receta a la farmacia.  - Tambin puede pasar por nuestra oficina durante el horario de atencin regular y Education officer, museum una tarjeta de cupones de GoodRx.  - Si necesita que su receta se enve electrnicamente a una farmacia diferente, informe a nuestra oficina a travs de MyChart de [place] o por telfono llamando al [phone] y presione la opcin 4.

## 2024-01-12 NOTE — Progress Notes (Signed)
   Follow-Up Visit   Subjective  Tonya Gregory is a 63 y.o. female who presents for the following: Botox for facial elastosis  The following portions of the chart were reviewed this encounter and updated as appropriate: medications, allergies, medical history  Review of Systems:  No other skin or systemic complaints except as noted in HPI or Assessment and Plan.  Objective  Well appearing patient in no apparent distress; mood and affect are within normal limits.  A focused examination was performed of the face.  Relevant physical exam findings are noted in the Assessment and Plan.   Injection map photo    Assessment & Plan   Facial Elastosis Botox today 35 units - Frown Complex 25 units - Brow Lift 5 units x 2  Location: See attached image  Informed consent: Discussed risks (infection, pain, bleeding, bruising, swelling, allergic reaction, paralysis of nearby muscles, eyelid droop, double vision, neck weakness, difficulty breathing, headache, undesirable cosmetic result, and need for additional treatment) and benefits of the procedure, as well as the alternatives.  Informed consent was obtained.  Preparation: The area was cleansed with alcohol.  Procedure Details:  Botox was injected into the dermis with a 30-gauge needle. Pressure applied to any bleeding. Ice packs offered for swelling.  Lot Number:  [id number] Expiration:  10/2025  Total Units Injected:  35  Plan: Tylenol  may be used for headache.  Allow 2 weeks before returning to clinic for additional dosing as needed. Patient will call for any problems.  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 Cause is unknown but it can be a genetic trait. It is not related to vitiligo. There is no treatment. Recommend photoprotection and regular use of spf 30 or  higher sunscreen which may help prevent the development of more white spots.  Treatment Plan:  Benign, observe.     Return for 3 - 4 month botox .  IRandee Busing, CMA, am acting as scribe for Celine Collard, MD.   Documentation: I have reviewed the above documentation for accuracy and completeness, and I agree with the above.  Celine Collard, MD

## 2024-01-15 ENCOUNTER — Other Ambulatory Visit: Payer: Self-pay | Admitting: Internal Medicine

## 2024-01-15 DIAGNOSIS — K649 Unspecified hemorrhoids: Secondary | ICD-10-CM

## 2024-01-15 MED ORDER — HYDROCORT-PRAMOXINE (PERIANAL) 1-1 % EX FOAM: 1.0000 | Freq: Two times a day (BID) | CUTANEOUS | 1 refills | Status: AC

## 2024-03-09 ENCOUNTER — Other Ambulatory Visit: Payer: Self-pay | Admitting: Internal Medicine

## 2024-03-14 ENCOUNTER — Encounter: Payer: Self-pay | Admitting: Dermatology

## 2024-03-14 ENCOUNTER — Ambulatory Visit (INDEPENDENT_AMBULATORY_CARE_PROVIDER_SITE_OTHER): Payer: Self-pay | Admitting: Dermatology

## 2024-03-14 DIAGNOSIS — L988 Other specified disorders of the skin and subcutaneous tissue: Secondary | ICD-10-CM

## 2024-03-14 NOTE — Patient Instructions (Signed)

## 2024-03-14 NOTE — Progress Notes (Signed)
   Follow-Up Visit   Subjective  Tonya Gregory is a 63 y.o. female who presents for the following: filler for facial elastosis  The following portions of the chart were reviewed this encounter and updated as appropriate: medications, allergies, medical history  Review of Systems:  No other skin or systemic complaints except as noted in HPI or Assessment and Plan.  Objective  Well appearing patient in no apparent distress; mood and affect are within normal limits.  A focused examination was performed of the face. Relevant physical exam findings are noted in the Assessment and Plan or shown in photos.  Before photos            Injection map photo   After photos                    Assessment & Plan    Facial Elastosis  Prior to the procedure, the patient's past medical history, allergies and the rare but potential risks and complications were reviewed with the patient and a signed consent was obtained. Pre and post-treatment care was discussed and instructions provided.   Location:  Voluma: 0.5cc R mid face, 0.3cc L mid face, 0.1cc L jaw, 0.1cc R jaw. Prejowl sulcus Refyne: 0.1cc upper lip, 0.1cc lower lip, 0.2cc L oral commissure, 0.2cc R oral commissure, 0.2cc L upper nasolabial fold, 0.2cc R upper nasolabial fold.                      Filler Type: Restylane refyne and Juvederm Voluma Refyne Lot: [id number] EXP: 05/03/2025 Voluma Lot: [id number] EXP: 01/03/2025  Procedure: Lidocaine -tetracaine ointment was applied to treatment areas to achieve good local anesthesia. The area was prepped thoroughly with Puracyn. After introducing the needle into the desired treatment area, the syringe plunger was drawn back to ensure there was no flash of blood prior to injecting the filler in order to minimize risk of intravascular injection and vascular occlusion. After injection of the filler, the treated areas were cleansed and iced to reduce swelling. Post-treatment  instructions were reviewed with the patient.       Patient tolerated the procedure well. The patient will call with any problems, questions or concerns prior to their next appointment.   Return if symptoms worsen or fail to improve.  I, Emerick Ege, CMA am acting as scribe for Rexene Rattler, MD.    Documentation: I have reviewed the above documentation for accuracy and completeness, and I agree with the above.  Rexene Rattler, MD

## 2024-03-24 ENCOUNTER — Other Ambulatory Visit: Payer: Self-pay | Admitting: Internal Medicine

## 2024-03-24 DIAGNOSIS — I152 Hypertension secondary to endocrine disorders: Secondary | ICD-10-CM

## 2024-04-25 ENCOUNTER — Other Ambulatory Visit: Payer: Self-pay | Admitting: Internal Medicine

## 2024-04-26 ENCOUNTER — Other Ambulatory Visit: Payer: Self-pay | Admitting: Internal Medicine

## 2024-04-26 DIAGNOSIS — Z1231 Encounter for screening mammogram for malignant neoplasm of breast: Secondary | ICD-10-CM

## 2024-04-27 ENCOUNTER — Encounter: Payer: Self-pay | Admitting: Dermatology

## 2024-04-27 ENCOUNTER — Ambulatory Visit (INDEPENDENT_AMBULATORY_CARE_PROVIDER_SITE_OTHER): Payer: Self-pay | Admitting: Dermatology

## 2024-04-27 DIAGNOSIS — L988 Other specified disorders of the skin and subcutaneous tissue: Secondary | ICD-10-CM

## 2024-04-27 NOTE — Patient Instructions (Signed)

## 2024-04-27 NOTE — Progress Notes (Signed)
   Follow-Up Visit   Subjective  Tonya Gregory is a 63 y.o. female who presents for the following: Botox for facial elastosis  The following portions of the chart were reviewed this encounter and updated as appropriate: medications, allergies, medical history  Review of Systems:  No other skin or systemic complaints except as noted in HPI or Assessment and Plan.  Objective  Well appearing patient in no apparent distress; mood and affect are within normal limits.  A focused examination was performed of the face.  Relevant physical exam findings are noted in the Assessment and Plan.  Before botox photos if first time, Injection map photo    Assessment & Plan    Facial Elastosis  Frown complex - 25 units B/L brow lift - 5 units each  Location: See attached image  Informed consent: Discussed risks (infection, pain, bleeding, bruising, swelling, allergic reaction, paralysis of nearby muscles, eyelid droop, double vision, neck weakness, difficulty breathing, headache, undesirable cosmetic result, and need for additional treatment) and benefits of the procedure, as well as the alternatives.  Informed consent was obtained.  Preparation: The area was cleansed with alcohol.  Procedure Details:  Botox was injected into the dermis with a 30-gauge needle. Pressure applied to any bleeding. Ice packs offered for swelling.  Lot Number:  [id number] Expiration:  05/2026  Total Units Injected:  35  Plan: Tylenol  may be used for headache.  Allow 2 weeks before returning to clinic for additional dosing as needed. Patient will call for any problems.  Return in about 3 months (around 07/27/2024) for Botox injections.  LILLETTE Rosina Mayans, CMA, am acting as scribe for Alm Rhyme, MD .   Documentation: I have reviewed the above documentation for accuracy and completeness, and I agree with the above.  Alm Rhyme, MD

## 2024-06-08 ENCOUNTER — Ambulatory Visit: Payer: Self-pay | Admitting: Dermatology

## 2024-06-08 DIAGNOSIS — I781 Nevus, non-neoplastic: Secondary | ICD-10-CM

## 2024-06-08 DIAGNOSIS — L719 Rosacea, unspecified: Secondary | ICD-10-CM

## 2024-06-08 NOTE — Progress Notes (Signed)
 Follow-Up Visit   Subjective  Tonya Gregory is a 63 y.o. female who presents for the following: Here for 2nd treatement at face rosacea    The following portions of the chart were reviewed this encounter and updated as appropriate: medications, allergies, medical history  Review of Systems:  No other skin or systemic complaints except as noted in HPI or Assessment and Plan.  Objective  Well appearing patient in no apparent distress; mood and affect are within normal limits.   A focused examination was performed of the following areas: face  Relevant exam findings are noted in the Assessment and Plan.                   Vessels at chin   Vessels    Right cheek   Left cheek    Vessels at chin   Vessels at chin          Telangiectasias at cheek, nose and chin     Assessment & Plan   ROSACEA with Telangiectasias at face  also treated sks with cryotherapy at left hand x 2 ,  eft forehead x 1 left temple lateral brow x 3, right cheek x 4 included in BBL price today    Exam: Mid face erythema with telangiectasias see photos    Chronic and persistent condition with duration or expected duration over one year. Condition is bothersome/symptomatic for patient. Currently flared.     Rosacea is a chronic progressive skin condition usually affecting the face of adults, causing redness and/or acne bumps. It is treatable but not curable. It sometimes affects the eyes (ocular rosacea) as well. It may respond to topical and/or systemic medication and can flare with stress, sun exposure, alcohol, exercise, topical steroids (including hydrocortisone /cortisone 10) and some foods.  Daily application of broad spectrum spf 30+ sunscreen to face is recommended to reduce flares.   Patient denies grittiness of the eyes   Treatment Plan BBL treatment today at face   Recommend up to 3 treatments    Laser safety: Patient was advised in laser safety.   Patient was fitted with laser safety goggles and advised to keep eyes closed during procedure with goggles on. Staff and provider ensured that patient and their own safety goggles were also on and eyes protected during procedure. Laser room door was secured and locked from the inside. Laser room door has laser safety sign affixed to the outside of the door.    Patient given sample kit     Prior to the procedure, the patient's past medical history, medications, allergies, and the rare but potential risks and complications were reviewed with the patient and a signed consent was obtained.  Pre and post treatment care was discussed and instructions provided.    SEBORRHEIC KERATOSIS - Stuck-on, waxy, tan-brown papules and/or plaques  - Benign-appearing - Discussed benign etiology and prognosis. - Observe - Call for any changes   ACTINIC DAMAGE - chronic, secondary to cumulative UV radiation exposure/sun exposure over time - diffuse scaly erythematous macules with underlying dyspigmentation - Recommend daily broad spectrum sunscreen SPF 30+ to sun-exposed areas, reapply every 2 hours as needed.  - Recommend staying in the shade or wearing long sleeves, sun glasses (UVA+UVB protection) and wide brim hats (4-inch brim around the entire circumference of the hat). - Call for new or changing lesions.     No follow-ups on file.  IEleanor Blush, CMA, am acting as scribe for Alm Rhyme, MD.  Documentation: I have reviewed the above documentation for accuracy and completeness, and I agree with the above.  Alm Rhyme, MD

## 2024-06-08 NOTE — Patient Instructions (Signed)

## 2024-06-14 ENCOUNTER — Encounter: Payer: Self-pay | Admitting: Dermatology

## 2024-06-20 ENCOUNTER — Ambulatory Visit: Admitting: Internal Medicine

## 2024-07-04 ENCOUNTER — Other Ambulatory Visit: Payer: Self-pay

## 2024-07-04 ENCOUNTER — Other Ambulatory Visit: Payer: Self-pay | Admitting: Internal Medicine

## 2024-07-04 DIAGNOSIS — S70361A Insect bite (nonvenomous), right thigh, initial encounter: Secondary | ICD-10-CM

## 2024-07-04 DIAGNOSIS — S70269A Insect bite (nonvenomous), unspecified hip, initial encounter: Secondary | ICD-10-CM

## 2024-07-04 DIAGNOSIS — W57XXXA Bitten or stung by nonvenomous insect and other nonvenomous arthropods, initial encounter: Secondary | ICD-10-CM

## 2024-07-05 ENCOUNTER — Telehealth: Payer: Self-pay

## 2024-07-05 LAB — LYME DISEASE SEROLOGY W/REFLEX: Lyme Total Antibody EIA: NEGATIVE

## 2024-07-05 NOTE — Telephone Encounter (Signed)
 Copied from CRM [id number]. Topic: Appointments - Scheduling Inquiry for Clinic >> Jul 05, 2024  3:58 PM Harlene ORN wrote: Reason for CRM:  Patient called. She received her results of her Lyme Disease test and is concerned. Would like to discuss more about the test with her doctor tomorrow at her physical on 12/03.

## 2024-07-06 ENCOUNTER — Encounter: Payer: Self-pay | Admitting: Internal Medicine

## 2024-07-06 ENCOUNTER — Ambulatory Visit (INDEPENDENT_AMBULATORY_CARE_PROVIDER_SITE_OTHER): Payer: Self-pay | Admitting: Internal Medicine

## 2024-07-06 ENCOUNTER — Telehealth: Payer: Self-pay

## 2024-07-06 VITALS — BP 138/70 | HR 69 | Ht 68.0 in | Wt 166.8 lb

## 2024-07-06 DIAGNOSIS — Z23 Encounter for immunization: Secondary | ICD-10-CM

## 2024-07-06 DIAGNOSIS — S70361A Insect bite (nonvenomous), right thigh, initial encounter: Secondary | ICD-10-CM

## 2024-07-06 DIAGNOSIS — I10 Essential (primary) hypertension: Secondary | ICD-10-CM

## 2024-07-06 DIAGNOSIS — Z0000 Encounter for general adult medical examination without abnormal findings: Secondary | ICD-10-CM

## 2024-07-06 DIAGNOSIS — Z1211 Encounter for screening for malignant neoplasm of colon: Secondary | ICD-10-CM

## 2024-07-06 DIAGNOSIS — Z8619 Personal history of other infectious and parasitic diseases: Secondary | ICD-10-CM

## 2024-07-06 DIAGNOSIS — K760 Fatty (change of) liver, not elsewhere classified: Secondary | ICD-10-CM

## 2024-07-06 DIAGNOSIS — Z79890 Hormone replacement therapy (postmenopausal): Secondary | ICD-10-CM

## 2024-07-06 DIAGNOSIS — E7800 Pure hypercholesterolemia, unspecified: Secondary | ICD-10-CM

## 2024-07-06 LAB — HUMAN GRANULOCYTIC EHRLICH-HGE
HGE IgG Titer: NEGATIVE
HGE IgM Titer: NEGATIVE

## 2024-07-06 LAB — ROCKY MTN SPOTTED FVR ABS PNL(IGG+IGM)

## 2024-07-06 LAB — LYME DISEASE ABS IGG, IGM, IFA, CSF

## 2024-07-06 MED ORDER — ALBUTEROL SULFATE HFA 108 (90 BASE) MCG/ACT IN AERS: 1.0000 | INHALATION_SPRAY | Freq: Four times a day (QID) | RESPIRATORY_TRACT | 3 refills | Status: AC | PRN

## 2024-07-06 NOTE — Telephone Encounter (Signed)
 Will discuss with provider during her visit today.

## 2024-07-06 NOTE — Patient Instructions (Addendum)
 YOUR BLOOD PRESSURE IS SLIGHTLY ELEVATED  TODAY.  AN IDEAL BP IS  ABOVE 100/60 AND BELOW 130/80.  Please check your blood pressure a few times at home . IF READINGS ARE > 130/80   WE WILL INCREASE YOUR AMLODIPINE   DOSE TO 5 MG DAILY    RETURN IN 6 MONTHS FOR YOUR ANNUAL CPE

## 2024-07-06 NOTE — Telephone Encounter (Signed)
 Patient had a physical today, but on her AVS, it states she is to have a physical in six months.  I don't see any check-out notes in the system.  Patient states she is ok to go ahead and have another physical in six months because of the insurance she has, so I scheduled the appointment for her on 01/11/2025.  Patient states she would like to have labs prior to her physical on 01/11/2025.  I scheduled an appointment for patient to have her labs on 01/09/2025, but lab orders will need to be entered.

## 2024-07-06 NOTE — Progress Notes (Unsigned)
 Subjective:  Patient ID: Tonya Gregory, female    DOB: Nov 19, 1960  Age: 63 y.o. MRN: [id number]  CC: The primary encounter diagnosis was Primary hypertension. Diagnoses of Pure hypercholesterolemia, Colon cancer screening, Need for influenza vaccination, Post-menopause on HRT (hormone replacement therapy), Hepatic steatosis, and Tick bite of right thigh with local reaction, initial encounter were also pertinent to this visit.   HPI Tonya Gregory presents for  Chief Complaint  Patient presents with   Follow up        1) HRT:  Tonya Gregory has been receiving testosterone and estrogen pellets,  taking  progesterone  daily   prescribed  by [facility] in [place]   in the left hip .  Since starting HRT her mood has improved as well as her energy level and her skin texture.    2-) HTN: Patient is taking amlodipine  2.5 mg daily as prescribed and notes no adverse effects.  Home BP readings have not been done lately .  Tonya Gregory is avoiding added salt in her diet and walking regularly about 3 times per week for exercise  .  3)  history of tick bite in May: patient did not return for tick antibody panel in May,  it was run yesterday because the order was still open when Tonya Gregory presented for labs and Tonya Gregory did not request cancellation at the time of the phlebotomy, .  Tonya Gregory is requesting that the lab be cancelled  which unfortunately cannot be done since the lab was run.    Outpatient Medications Prior to Visit  Medication Sig Dispense Refill   acetaminophen  (TYLENOL ) 500 MG tablet Take 500 mg by mouth every 6 (six) hours as needed (for pain/headaches.).     amLODipine  (NORVASC ) 2.5 MG tablet TAKE 1 TABLET BY MOUTH DAILY 90 tablet 1   atorvastatin  (LIPITOR) 10 MG tablet TAKE 1 TABLET BY MOUTH DAILY 90 tablet 3   ezetimibe  (ZETIA ) 10 MG tablet TAKE 1 TABLET BY MOUTH DAILY 90 tablet 1   hydrocortisone -pramoxine (PROCTOFOAM-HC) rectal foam Place 1 applicator rectally 2 (two) times daily. 10 g 1   ibuprofen   (ADVIL ,MOTRIN ) 200 MG tablet Take 400 mg by mouth every 8 (eight) hours as needed (for pain/headaches.).     ketoconazole  (NIZORAL ) 2 % shampoo Shampoo onto skin let sit 5-10 minutes then wash off. Use 3d/wk for 6 weeks. Then QM thereafter. 120 mL 5   Nutritional Supplements (JUICE PLUS FIBRE PO) Take 6 capsules by mouth daily. Take 2 tablet Juice Plus Fruit, 2 tablets of Vegetable, & 2 tablets of Berry Blend in the morning     Omega-3 Fatty Acids (FISH OIL PO) Take 1 capsule by mouth daily.     PRESCRIPTION MEDICATION PT GETTING TESTOSTERONE PELLETS INJECTED AT [facility] WITH DR GRANT-LAST INJECTED DEC 2018     progesterone (PROMETRIUM) 200 MG capsule Take 200 mg by mouth at bedtime.     thyroid (ARMOUR) 15 MG tablet Take 15 mg by mouth daily.     albuterol  (VENTOLIN  HFA) 108 (90 Base) MCG/ACT inhaler Inhale 1 puff into the lungs every 6 (six) hours as needed for wheezing or shortness of breath. 8.5 g 3   doxycycline  (VIBRA -TABS) 100 MG tablet Take 1 tablet (100 mg total) by mouth 2 (two) times daily. (Patient not taking: Reported on 07/06/2024) 14 tablet 0   fluconazole  (DIFLUCAN ) 150 MG tablet Take 1 tablet (150 mg total) by mouth daily. (Patient not taking: Reported on 07/06/2024) 2 tablet 0  No facility-administered medications prior to visit.    Review of Systems;  Patient denies headache, fevers, malaise, unintentional weight loss, skin rash, eye pain, sinus congestion and sinus pain, sore throat, dysphagia,  hemoptysis , cough, dyspnea, wheezing, chest pain, palpitations, orthopnea, edema, abdominal pain, nausea, melena, diarrhea, constipation, flank pain, dysuria, hematuria, urinary  Frequency, nocturia, numbness, tingling, seizures,  Focal weakness, Loss of consciousness,  Tremor, insomnia, depression, anxiety, and suicidal ideation.      Objective:  BP 138/70   Pulse 69   Ht 5' 8 (1.727 m)   Wt 166 lb 12.8 oz (75.7 kg)   LMP 11/15/2014   SpO2 97%   BMI  25.36 kg/m   BP Readings from Last 3 Encounters:  07/06/24 138/70  12/17/23 120/66  06/19/23 110/72    Wt Readings from Last 3 Encounters:  07/06/24 166 lb 12.8 oz (75.7 kg)  12/17/23 162 lb (73.5 kg)  06/19/23 167 lb 3.2 oz (75.8 kg)    Physical Exam Vitals reviewed.  Constitutional:      General: Tonya Gregory is not in acute distress.    Appearance: Normal appearance. Tonya Gregory is normal weight. Tonya Gregory is not ill-appearing, toxic-appearing or diaphoretic.  HENT:     Head: Normocephalic.  Eyes:     General: No scleral icterus.       Right eye: No discharge.        Left eye: No discharge.     Conjunctiva/sclera: Conjunctivae normal.  Cardiovascular:     Rate and Rhythm: Normal rate and regular rhythm.     Heart sounds: Normal heart sounds.  Pulmonary:     Effort: Pulmonary effort is normal. No respiratory distress.     Breath sounds: Normal breath sounds.  Musculoskeletal:        General: Normal range of motion.  Skin:    General: Skin is warm and dry.  Neurological:     General: No focal deficit present.     Mental Status: Tonya Gregory is alert and oriented to person, place, and time. Mental status is at baseline.  Psychiatric:        Mood and Affect: Mood normal.        Behavior: Behavior normal.        Thought Content: Thought content normal.        Judgment: Judgment normal.     Lab Results  Component Value Date   HGBA1C 5.1 12/14/2023   HGBA1C 5.5 12/15/2022   HGBA1C 5.8 06/07/2021    Lab Results  Component Value Date   CREATININE 0.77 07/06/2024   CREATININE 0.88 12/14/2023   CREATININE 0.91 06/17/2023    Lab Results  Component Value Date   WBC 5.4 12/14/2023   HGB 15.0 12/14/2023   HCT 43.8 12/14/2023   PLT 207.0 12/14/2023   GLUCOSE 89 07/06/2024   CHOL 193 12/14/2023   TRIG 105 12/14/2023   HDL 51 12/14/2023   LDLDIRECT 143.0 06/17/2023   LDLCALC 121 (H) 12/14/2023   ALT 25 07/06/2024   AST 20 07/06/2024   NA 139 07/06/2024   K 4.0 07/06/2024   CL 102  07/06/2024   CREATININE 0.77 07/06/2024   BUN 17 07/06/2024   CO2 28 07/06/2024   TSH 2.21 12/14/2023   INR 1.1 (H) 12/14/2023   HGBA1C 5.1 12/14/2023    MM 3D SCREENING MAMMOGRAM BILATERAL BREAST Result Date: 03/31/2023 CLINICAL DATA:  Screening. EXAM: DIGITAL SCREENING BILATERAL MAMMOGRAM WITH TOMOSYNTHESIS AND CAD TECHNIQUE: Bilateral screening digital craniocaudal and mediolateral oblique mammograms were  obtained. Bilateral screening digital breast tomosynthesis was performed. The images were evaluated with computer-aided detection. COMPARISON:  Previous exam(s). ACR Breast Density Category a: The breasts are almost entirely fatty. FINDINGS: There are no findings suspicious for malignancy. IMPRESSION: No mammographic evidence of malignancy. A result letter of this screening mammogram will be mailed directly to the patient. RECOMMENDATION: Screening mammogram in one year. (Code:SM-B-01Y) BI-RADS CATEGORY  1: Negative. Electronically Signed   By: Rosaline Collet M.D.   On: 03/31/2023 16:27    Assessment & Plan:  .Primary hypertension Assessment & Plan: Tonya Gregory has a history of hypertension and  has an several readings .  Tonya Gregory notes increased stress since her residential move due to her mother's decline and plaement.  Tonya Gregory has been asked to check her pressures at home and submit readings for evaluation. Amlodipine  dose can be increased to 5 mg daily if needed.    Pure hypercholesterolemia -     Comprehensive metabolic panel with GFR -     Comprehensive metabolic panel with GFR; Future -     Lipid panel; Future -     LDL cholesterol, direct; Future  Colon cancer screening -     Cologuard  Need for influenza vaccination -     Flu vaccine trivalent PF, 6mos and older(Flulaval,Afluria,Fluarix,Fluzone)  Post-menopause on HRT (hormone replacement therapy) Assessment & Plan: Managed by [employer] with armour thryoid,  oral progesterone and estrogen/testosterone pellet  implantation    Hepatic steatosis Assessment & Plan: Tonya Gregory discontinued statin due to diffuse myalgias.  LFTs are normal   Lab Results  Component Value Date   ALT 25 07/06/2024   AST 20 07/06/2024   ALKPHOS 56 07/06/2024   BILITOT 1.1 07/06/2024      Tick bite of right thigh with local reaction, initial encounter Assessment & Plan: Testing for ehrlichiosis and Lyme disease was recently done and normal    Other orders -     Albuterol  Sulfate HFA; Inhale 1 puff into the lungs every 6 (six) hours as needed for wheezing or shortness of breath.  Dispense: 8.5 g; Refill: 3   I personally spent a total of 34 minutes in the care of the patient today including preparing to see the patient, getting/reviewing separately obtained history, performing a medically appropriate exam/evaluation, counseling and educating, placing orders, documenting clinical information in the EHR, independently interpreting results, and communicating results.  Follow-up: No follow-ups on file.   Verneita LITTIE Kettering, MD

## 2024-07-07 ENCOUNTER — Ambulatory Visit
Admission: RE | Admit: 2024-07-07 | Discharge: 2024-07-07 | Disposition: A | Source: Ambulatory Visit | Attending: Internal Medicine | Admitting: Internal Medicine

## 2024-07-07 DIAGNOSIS — Z1231 Encounter for screening mammogram for malignant neoplasm of breast: Secondary | ICD-10-CM

## 2024-07-07 LAB — COMPREHENSIVE METABOLIC PANEL WITH GFR
ALT: 25 U/L (ref 0–35)
AST: 20 U/L (ref 0–37)
Albumin: 4.6 g/dL (ref 3.5–5.2)
Alkaline Phosphatase: 56 U/L (ref 39–117)
BUN: 17 mg/dL (ref 6–23)
CO2: 28 meq/L (ref 19–32)
Calcium: 9.6 mg/dL (ref 8.4–10.5)
Chloride: 102 meq/L (ref 96–112)
Creatinine, Ser: 0.77 mg/dL (ref 0.40–1.20)
GFR: 81.6 mL/min (ref >60.00)
Glucose, Bld: 89 mg/dL (ref 70–99)
Potassium: 4 meq/L (ref 3.5–5.1)
Sodium: 139 meq/L (ref 135–145)
Total Bilirubin: 1.1 mg/dL (ref 0.2–1.2)
Total Protein: 7.1 g/dL (ref 6.0–8.3)

## 2024-07-07 NOTE — Assessment & Plan Note (Signed)
 She discontinued statin due to diffuse myalgias.  LFTs are normal   Lab Results  Component Value Date   ALT 25 07/06/2024   AST 20 07/06/2024   ALKPHOS 56 07/06/2024   BILITOT 1.1 07/06/2024

## 2024-07-07 NOTE — Assessment & Plan Note (Signed)
 Testing for ehrlichiosis and Lyme disease was recently done and normal

## 2024-07-07 NOTE — Assessment & Plan Note (Signed)
 Managed by [employer] with armour thryoid,  oral progesterone and estrogen/testosterone pellet implantation

## 2024-07-07 NOTE — Assessment & Plan Note (Signed)
 She has a history of hypertension and  has an several readings .  She notes increased stress since her residential move due to her mother's decline and plaement.  She has been asked to check her pressures at home and submit readings for evaluation. Amlodipine  dose can be increased to 5 mg daily if needed.

## 2024-07-08 ENCOUNTER — Ambulatory Visit: Payer: Self-pay | Admitting: Internal Medicine

## 2024-07-11 NOTE — Telephone Encounter (Signed)
 Lab orders have been placed

## 2024-08-09 ENCOUNTER — Ambulatory Visit: Admitting: Dermatology

## 2024-08-11 ENCOUNTER — Telehealth: Payer: Self-pay

## 2024-08-11 ENCOUNTER — Ambulatory Visit (INDEPENDENT_AMBULATORY_CARE_PROVIDER_SITE_OTHER): Payer: Self-pay | Admitting: Dermatology

## 2024-08-11 ENCOUNTER — Encounter: Payer: Self-pay | Admitting: Dermatology

## 2024-08-11 DIAGNOSIS — L821 Other seborrheic keratosis: Secondary | ICD-10-CM

## 2024-08-11 DIAGNOSIS — L988 Other specified disorders of the skin and subcutaneous tissue: Secondary | ICD-10-CM

## 2024-08-11 NOTE — Telephone Encounter (Signed)
 We received a fax from [facility] requesting insurance information for patient.  [facility] asked that we fax the information to them.  I returned their fax with a note letting them know that, per Epic system, patient is self-pay.

## 2024-08-11 NOTE — Progress Notes (Signed)
" ° °  Follow-Up Visit   Subjective  Tonya Gregory is a 64 y.o. female who presents for the following: Botox for facial elastosis, check spot scalp  The following portions of the chart were reviewed this encounter and updated as appropriate: medications, allergies, medical history  Review of Systems:  No other skin or systemic complaints except as noted in HPI or Assessment and Plan.  Objective  Well appearing patient in no apparent distress; mood and affect are within normal limits.  A focused examination was performed of the face.  Relevant physical exam findings are noted in the Assessment and Plan.  Injection map photo    crown scalp x1 Stuck on waxy paps with erythema   Assessment & Plan   SEBORRHEIC KERATOSIS crown scalp x1 See photo. patient would like treated.  Recheck at f/u/   Discussed self pay fee $60.00 - Destruction of lesion - crown scalp x1 Complexity: simple   Destruction method: cryotherapy   Informed consent: discussed and consent obtained   Timeout:  patient name, date of birth, surgical site, and procedure verified Lesion destroyed using liquid nitrogen: Yes   Region frozen until ice ball extended beyond lesion: Yes   Outcome: patient tolerated procedure well with no complications   Post-procedure details: wound care instructions given   Additional details:  Prior to procedure, discussed risks of blister formation, small wound, skin dyspigmentation, or rare scar following cryotherapy. Recommend Vaseline ointment to treated areas while healing.   ELASTOSIS OF SKIN     Facial Elastosis  Botox 35 units injected today to: Frown complex- 25 units Brow lift- 5 units x 2  Location: Frown complex, bilateral brow lift  Informed consent: Discussed risks (infection, pain, bleeding, bruising, swelling, allergic reaction, paralysis of nearby muscles, eyelid droop, double vision, neck weakness, difficulty breathing, headache, undesirable cosmetic  result, and need for additional treatment) and benefits of the procedure, as well as the alternatives.  Informed consent was obtained.  Preparation: The area was cleansed with alcohol.  Procedure Details:  Botox was injected into the dermis with a 30-gauge needle. Pressure applied to any bleeding. Ice packs offered for swelling.  Lot Number:  [id number] Expiration:  07/2026  Total Units Injected:  35 units   Plan: Tylenol  may be used for headache.  Allow 2 weeks before returning to clinic for additional dosing as needed. Patient will call for any problems.   Return for 3-10m Botox.  LILLETTE Lonell ROCKFORD Wilfred, CMA, am acting as scribe for Alm Rhyme, MD.  Documentation: I have reviewed the above documentation for accuracy and completeness, and I agree with the above.  Alm Rhyme, MD   "

## 2024-08-11 NOTE — Patient Instructions (Signed)

## 2024-09-20 ENCOUNTER — Ambulatory Visit: Admitting: Dermatology

## 2024-10-27 ENCOUNTER — Ambulatory Visit: Admitting: Dermatology

## 2025-01-09 ENCOUNTER — Other Ambulatory Visit

## 2025-01-11 ENCOUNTER — Encounter: Admitting: Internal Medicine

## 2025-04-17 ENCOUNTER — Ambulatory Visit: Admitting: Dermatology

## 2025-05-15 ENCOUNTER — Ambulatory Visit: Admitting: Dermatology
